# Patient Record
Sex: Female | Born: 1985 | Race: Black or African American | Hispanic: No | Marital: Married | Smoking: Never smoker
Health system: Southern US, Community
[De-identification: ages and names within clinical notes are randomized; demographics above are authoritative.]

## PROBLEM LIST (undated history)

## (undated) DIAGNOSIS — A6920 Lyme disease, unspecified: Secondary | ICD-10-CM

## (undated) DIAGNOSIS — G43909 Migraine, unspecified, not intractable, without status migrainosus: Secondary | ICD-10-CM

## (undated) DIAGNOSIS — I10 Essential (primary) hypertension: Secondary | ICD-10-CM

## (undated) DIAGNOSIS — F32A Depression, unspecified: Secondary | ICD-10-CM

## (undated) DIAGNOSIS — M3500 Sicca syndrome, unspecified: Secondary | ICD-10-CM

## (undated) DIAGNOSIS — B009 Herpesviral infection, unspecified: Secondary | ICD-10-CM

## (undated) DIAGNOSIS — D649 Anemia, unspecified: Secondary | ICD-10-CM

## (undated) DIAGNOSIS — F329 Major depressive disorder, single episode, unspecified: Secondary | ICD-10-CM

## (undated) DIAGNOSIS — K219 Gastro-esophageal reflux disease without esophagitis: Secondary | ICD-10-CM

## (undated) HISTORY — PX: WISDOM TOOTH EXTRACTION: SHX21

## (undated) HISTORY — PX: THERAPEUTIC ABORTION: SHX798

---

## 2011-09-06 ENCOUNTER — Other Ambulatory Visit (HOSPITAL_COMMUNITY)
Admission: RE | Admit: 2011-09-06 | Discharge: 2011-09-06 | Disposition: A | Discharge status: Home / Self Care | Payer: 59 | Source: Ambulatory Visit | Attending: Family Medicine | Admitting: Family Medicine

## 2011-09-06 DIAGNOSIS — Z124 Encounter for screening for malignant neoplasm of cervix: Secondary | ICD-10-CM | POA: Insufficient documentation

## 2012-01-19 ENCOUNTER — Emergency Department (HOSPITAL_BASED_OUTPATIENT_CLINIC_OR_DEPARTMENT_OTHER): Payer: 59

## 2012-01-19 ENCOUNTER — Emergency Department (HOSPITAL_BASED_OUTPATIENT_CLINIC_OR_DEPARTMENT_OTHER)
Admission: EM | Admit: 2012-01-19 | Discharge: 2012-01-19 | Disposition: A | Discharge status: Home / Self Care | Payer: 59 | Attending: Emergency Medicine | Admitting: Emergency Medicine

## 2012-01-19 ENCOUNTER — Encounter (HOSPITAL_BASED_OUTPATIENT_CLINIC_OR_DEPARTMENT_OTHER): Payer: Self-pay | Admitting: *Deleted

## 2012-01-19 DIAGNOSIS — Z8619 Personal history of other infectious and parasitic diseases: Secondary | ICD-10-CM | POA: Insufficient documentation

## 2012-01-19 DIAGNOSIS — Z8679 Personal history of other diseases of the circulatory system: Secondary | ICD-10-CM | POA: Insufficient documentation

## 2012-01-19 DIAGNOSIS — R0789 Other chest pain: Secondary | ICD-10-CM | POA: Insufficient documentation

## 2012-01-19 DIAGNOSIS — Z79899 Other long term (current) drug therapy: Secondary | ICD-10-CM | POA: Insufficient documentation

## 2012-01-19 DIAGNOSIS — R071 Chest pain on breathing: Secondary | ICD-10-CM | POA: Insufficient documentation

## 2012-01-19 HISTORY — DX: Lyme disease, unspecified: A69.20

## 2012-01-19 HISTORY — DX: Migraine, unspecified, not intractable, without status migrainosus: G43.909

## 2012-01-19 LAB — CBC WITH DIFFERENTIAL/PLATELET
Basophils Absolute: 0 10*3/uL (ref 0.0–0.1)
Basophils Relative: 0 % (ref 0–1)
Eosinophils Absolute: 0.1 10*3/uL (ref 0.0–0.7)
Eosinophils Relative: 1 % (ref 0–5)
HCT: 33.2 % — ABNORMAL LOW (ref 36.0–46.0)
Lymphocytes Relative: 49 % — ABNORMAL HIGH (ref 12–46)
MCH: 28.4 pg (ref 26.0–34.0)
MCHC: 33.7 g/dL (ref 30.0–36.0)
MCV: 84.3 fL (ref 78.0–100.0)
Monocytes Absolute: 0.5 10*3/uL (ref 0.1–1.0)
Platelets: 256 10*3/uL (ref 150–400)
RDW: 12.7 % (ref 11.5–15.5)
WBC: 6.4 10*3/uL (ref 4.0–10.5)

## 2012-01-19 LAB — D-DIMER, QUANTITATIVE: D-Dimer, Quant: 0.59 ug/mL-FEU — ABNORMAL HIGH (ref 0.00–0.48)

## 2012-01-19 LAB — BASIC METABOLIC PANEL
BUN: 7 mg/dL (ref 6–23)
CO2: 26 mEq/L (ref 19–32)
Calcium: 9.8 mg/dL (ref 8.4–10.5)
Chloride: 105 mEq/L (ref 96–112)
Creatinine, Ser: 0.9 mg/dL (ref 0.50–1.10)
Glucose, Bld: 91 mg/dL (ref 70–99)

## 2012-01-19 MED ORDER — HYDROCODONE-ACETAMINOPHEN 5-325 MG PO TABS: 2.0000 | ORAL_TABLET | ORAL | Status: AC | PRN

## 2012-01-19 MED ORDER — IOHEXOL 350 MG/ML SOLN
100.0000 mL | Freq: Once | INTRAVENOUS | Status: AC | PRN
  Administered 2012-01-19: 100 mL via INTRAVENOUS

## 2012-01-19 MED ORDER — FAMOTIDINE 20 MG PO TABS: 20.0000 mg | ORAL_TABLET | Freq: Two times a day (BID) | ORAL | Status: DC

## 2012-01-19 MED ORDER — GI COCKTAIL ~~LOC~~
30.0000 mL | Freq: Once | ORAL | Status: AC
  Administered 2012-01-19: 30 mL via ORAL
  Filled 2012-01-19: qty 30

## 2012-01-19 NOTE — ED Provider Notes (Signed)
History     CSN: [id number]  Arrival date & time 01/19/12  1944   First MD Initiated Contact with Patient 01/19/12 2059      Chief Complaint  Patient presents with  . Shortness of Breath    (Consider location/radiation/quality/duration/timing/severity/associated sxs/prior treatment) Patient is a 26 y.o. female presenting with shortness of breath. The history is provided by the patient. No language interpreter was used.  Shortness of Breath  The current episode started 5 to 7 days ago. The problem occurs continuously. The problem has been unchanged. The problem is moderate. Nothing relieves the symptoms. Nothing aggravates the symptoms. Associated symptoms include shortness of breath. There was no intake of a foreign body. She was not exposed to toxic fumes. She has not inhaled smoke recently. Her past medical history does not include asthma.  Pt complains of left sided chest pain,  Tightness in throat.   Pt reports her shoes have been fitting tighter than usual.   Pt reports she was on a trip to [place].  Pt flew home on 12/10.  Pt reports she has tried prilosec without relief   Past Medical History  Diagnosis Date  . Lyme disease   . Migraines     History reviewed. No pertinent past surgical history.  History reviewed. No pertinent family history.  History  Substance Use Topics  . Smoking status: Never Smoker   . Smokeless tobacco: Not on file  . Alcohol Use: No    OB History    Grav Para Term Preterm Abortions TAB SAB Ect Mult Living                  Review of Systems  Respiratory: Positive for chest tightness and shortness of breath.   All other systems reviewed and are negative.    Allergies  Review of patient's allergies indicates no known allergies.  Home Medications   Current Outpatient Rx  Name  Route  Sig  Dispense  Refill  . TOPIRAMATE 100 MG PO TABS   Oral   Take 100 mg by mouth 2 (two) times daily.           BP 148/85  Pulse 64  Temp 98.9  F (37.2 C) (Oral)  Resp 18  Ht 5\' 6"  (1.676 m)  Wt 200 lb (90.719 kg)  BMI 32.28 kg/m2  SpO2 100%  LMP 12/22/2011  Physical Exam  Nursing note and vitals reviewed. Constitutional: She is oriented to person, place, and time. She appears well-developed and well-nourished.  HENT:  Head: Normocephalic and atraumatic.  Eyes: Conjunctivae normal and EOM are normal. Pupils are equal, round, and reactive to light.  Neck: Normal range of motion. Neck supple.  Cardiovascular: Normal rate and normal heart sounds.   Pulmonary/Chest: Effort normal.  Abdominal: Soft.  Musculoskeletal: Normal range of motion.  Neurological: She is alert and oriented to person, place, and time.  Skin: Skin is warm.  Psychiatric: She has a normal mood and affect.    ED Course  Procedures (including critical care time)  Labs Reviewed - No data to display No results found.   No diagnosis found.    MDM   Results for orders placed during the hospital encounter of 01/19/12  D-DIMER, QUANTITATIVE      Component Value Range   D-Dimer, Quant 0.59 (*) 0.00 - 0.48 ug/mL-FEU  BASIC METABOLIC PANEL      Component Value Range   Sodium 140  135 - 145 mEq/L   Potassium 3.9  3.5 -  5.1 mEq/L   Chloride 105  96 - 112 mEq/L   CO2 26  19 - 32 mEq/L   Glucose, Bld 91  70 - 99 mg/dL   BUN 7  6 - 23 mg/dL   Creatinine, Ser 1.61  0.50 - 1.10 mg/dL   Calcium 9.8  8.4 - 09.6 mg/dL   GFR calc non Af Amer 86 (*) >90 mL/min   GFR calc Af Amer >90  >90 mL/min  CBC WITH DIFFERENTIAL      Component Value Range   WBC 6.4  4.0 - 10.5 K/uL   RBC 3.94  3.87 - 5.11 MIL/uL   Hemoglobin 11.2 (*) 12.0 - 15.0 g/dL   HCT 04.5 (*) 40.9 - 81.1 %   MCV 84.3  78.0 - 100.0 fL   MCH 28.4  26.0 - 34.0 pg   MCHC 33.7  30.0 - 36.0 g/dL   RDW 91.4  78.2 - 95.6 %   Platelets 256  150 - 400 K/uL   Neutrophils Relative 43  43 - 77 %   Neutro Abs 2.8  1.7 - 7.7 K/uL   Lymphocytes Relative 49 (*) 12 - 46 %   Lymphs Abs 3.1  0.7 - 4.0  K/uL   Monocytes Relative 7  3 - 12 %   Monocytes Absolute 0.5  0.1 - 1.0 K/uL   Eosinophils Relative 1  0 - 5 %   Eosinophils Absolute 0.1  0.0 - 0.7 K/uL   Basophils Relative 0  0 - 1 %   Basophils Absolute 0.0  0.0 - 0.1 K/uL   No results found.  Date: 01/19/2012  Rate: *68  Rhythm: normal sinus rhythm  QRS Axis: normal  Intervals: normal  ST/T Wave abnormalities: normal  Conduction Disutrbances:none  Narrative Interpretation:   Old EKG Reviewed: none available  Pt's ddimer is elevated,  Due to pain and recent travel ct scan is obtained.   No relief with Gi cocktail.   Pt given rx for hydrocodone.  Pt advised to continue pepcid and see her MD for recheck       Elson Areas, [place] 01/19/12 2224

## 2012-01-19 NOTE — ED Notes (Signed)
Pt c/o chest congestion and heaviness. Denies cough or fever. Denies CP at this time

## 2012-01-19 NOTE — ED Notes (Signed)
Pt was seen by PMD for same and started on prilosec without relief. Pt denies any burning feeling at this time. Pt also states that she has had swelling in her ankles.

## 2012-01-20 NOTE — ED Provider Notes (Signed)
History/physical exam/procedure(s) were performed by non-physician practitioner and as supervising physician I was immediately available for consultation/collaboration. I have reviewed all notes and am in agreement with care and plan.   Beryle Zeitz S Tinna Kolker, MD 01/20/12 1509 

## 2012-02-18 ENCOUNTER — Other Ambulatory Visit: Payer: Self-pay | Admitting: Family Medicine

## 2012-02-18 DIAGNOSIS — R1310 Dysphagia, unspecified: Secondary | ICD-10-CM

## 2012-02-23 ENCOUNTER — Other Ambulatory Visit: Payer: 59

## 2012-02-25 ENCOUNTER — Ambulatory Visit
Admission: RE | Admit: 2012-02-25 | Discharge: 2012-02-25 | Disposition: A | Discharge status: Home / Self Care | Payer: 59 | Source: Ambulatory Visit | Attending: Family Medicine | Admitting: Family Medicine

## 2012-02-25 DIAGNOSIS — R1310 Dysphagia, unspecified: Secondary | ICD-10-CM

## 2012-05-30 ENCOUNTER — Ambulatory Visit: Payer: Self-pay | Admitting: Nurse Practitioner

## 2012-10-30 ENCOUNTER — Other Ambulatory Visit (HOSPITAL_COMMUNITY)
Admission: RE | Admit: 2012-10-30 | Discharge: 2012-10-30 | Disposition: A | Discharge status: Home / Self Care | Payer: 59 | Source: Ambulatory Visit | Attending: Family Medicine | Admitting: Family Medicine

## 2012-10-30 ENCOUNTER — Other Ambulatory Visit: Payer: Self-pay | Admitting: Physician Assistant

## 2012-10-30 DIAGNOSIS — Z124 Encounter for screening for malignant neoplasm of cervix: Secondary | ICD-10-CM | POA: Insufficient documentation

## 2013-11-13 ENCOUNTER — Other Ambulatory Visit (HOSPITAL_COMMUNITY)
Admission: RE | Admit: 2013-11-13 | Discharge: 2013-11-13 | Disposition: A | Discharge status: Home / Self Care | Payer: 59 | Source: Ambulatory Visit | Attending: Family Medicine | Admitting: Family Medicine

## 2013-11-13 ENCOUNTER — Other Ambulatory Visit: Payer: Self-pay | Admitting: Physician Assistant

## 2013-11-13 DIAGNOSIS — Z124 Encounter for screening for malignant neoplasm of cervix: Secondary | ICD-10-CM | POA: Diagnosis present

## 2013-11-15 LAB — CYTOLOGY - PAP

## 2014-05-08 ENCOUNTER — Other Ambulatory Visit (HOSPITAL_COMMUNITY)
Admission: RE | Admit: 2014-05-08 | Discharge: 2014-05-08 | Disposition: A | Discharge status: Home / Self Care | Payer: Managed Care, Other (non HMO) | Source: Ambulatory Visit | Attending: Obstetrics & Gynecology | Admitting: Obstetrics & Gynecology

## 2014-05-08 ENCOUNTER — Other Ambulatory Visit: Payer: Self-pay | Admitting: Obstetrics & Gynecology

## 2014-05-08 DIAGNOSIS — Z1151 Encounter for screening for human papillomavirus (HPV): Secondary | ICD-10-CM | POA: Insufficient documentation

## 2014-05-08 DIAGNOSIS — Z01419 Encounter for gynecological examination (general) (routine) without abnormal findings: Secondary | ICD-10-CM | POA: Diagnosis not present

## 2014-05-13 LAB — CYTOLOGY - PAP

## 2014-09-27 IMAGING — CR DG CHEST 2V
2 series · 2 of 2 positions shown · non-contrast
Comparison: None.

CLINICAL DATA: Shortness of breath, chest congestion

CHEST - 2 VIEW

[w chest pa]
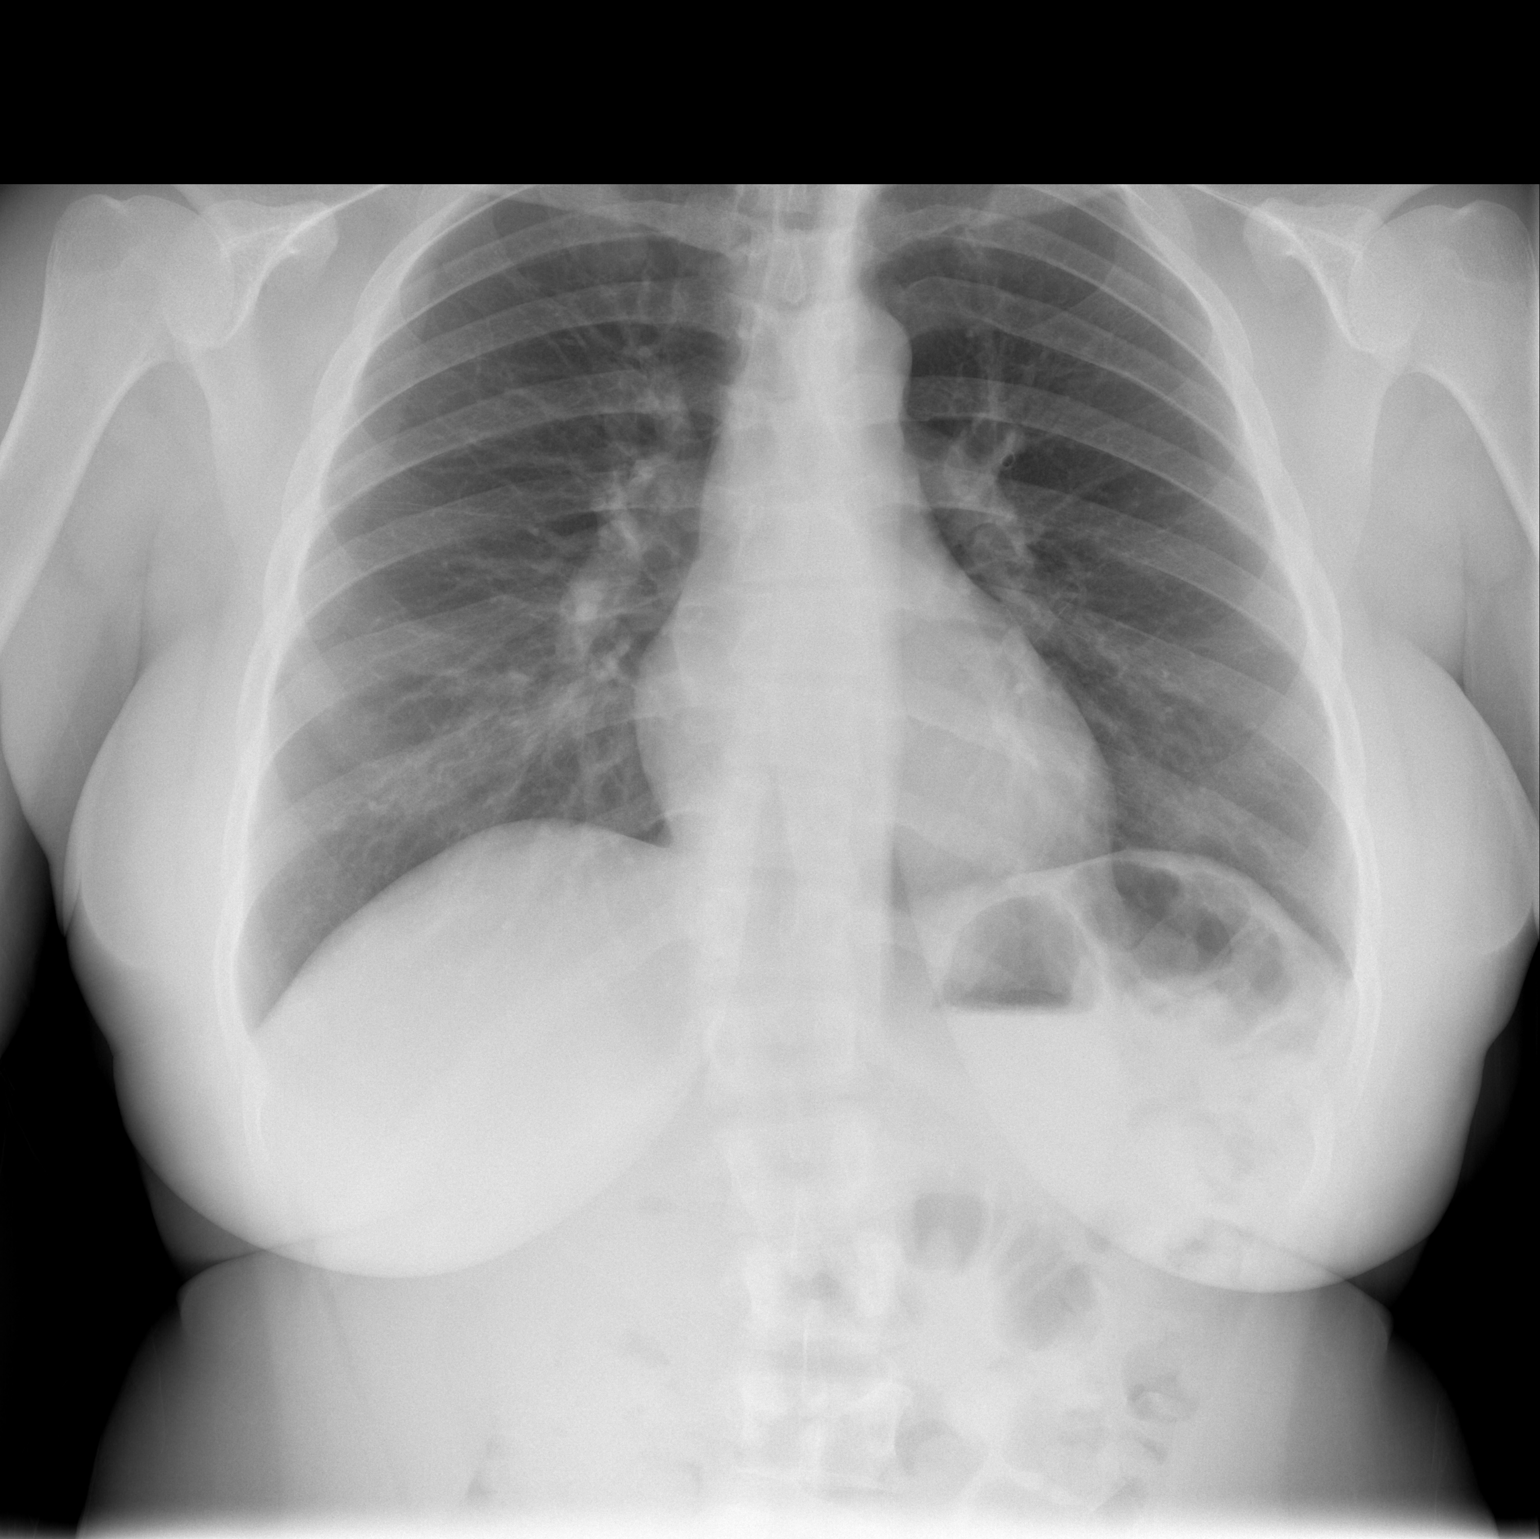

[w chest lat]
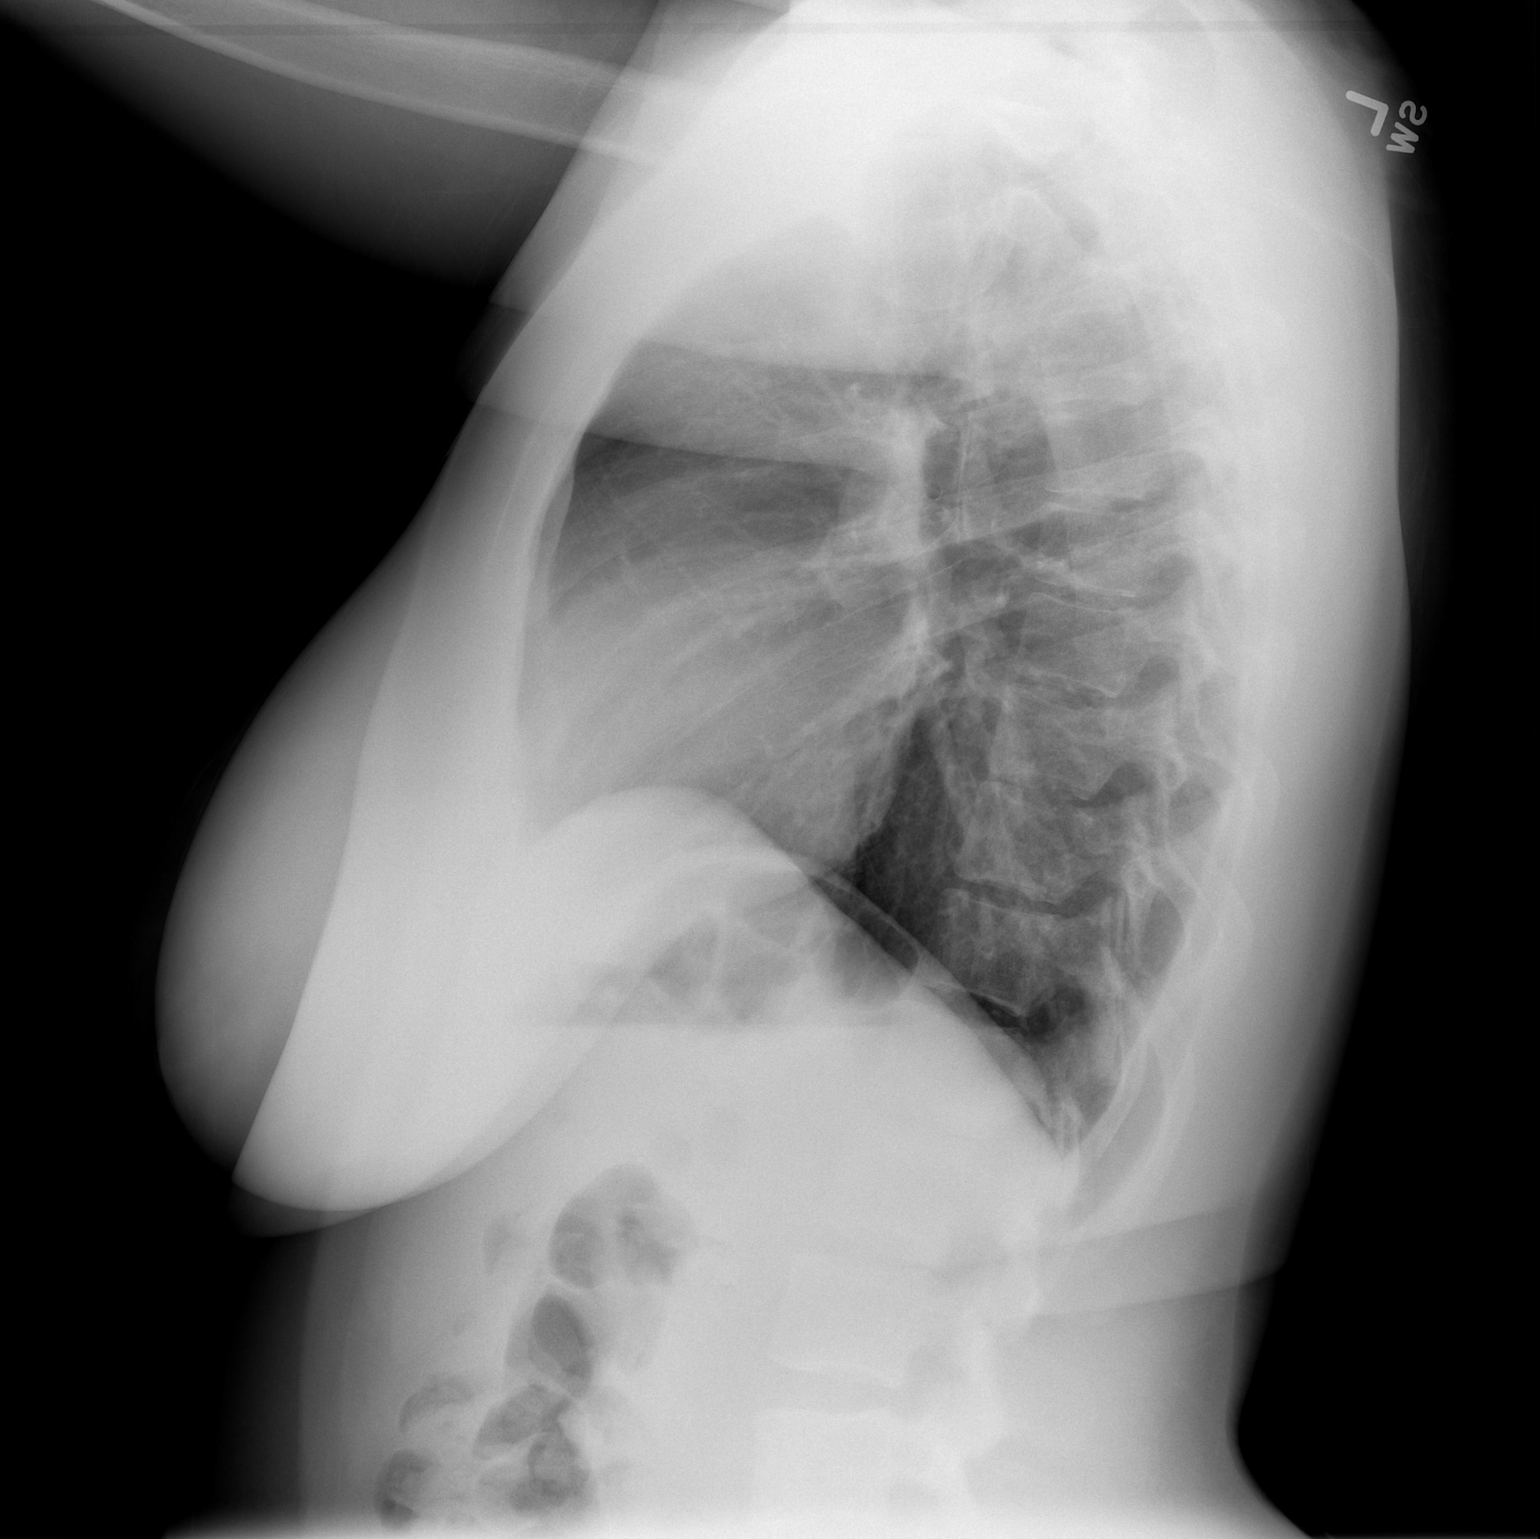

[2 of 2 positions shown; findings below may reference images not displayed]

FINDINGS: Lungs are clear. No pleural effusion or pneumothorax.

Cardiomediastinal silhouette is within normal limits.

Visualized osseous structures are within normal limits.
IMPRESSION: No evidence of acute cardiopulmonary disease.

## 2014-09-27 IMAGING — CT CT ANGIO CHEST
2 of 6 series · 19 of 36 positions shown · IV contrast (APPLIED)
Comparison: 01/19/2012 radiograph

CLINICAL DATA: Left-sided chest pain, elevated D-dimer.

CT ANGIOGRAPHY CHEST
TECHNIQUE: Multidetector CT imaging of the chest using the
standard protocol during bolus administration of intravenous
contrast. Multiplanar reconstructed images including MIPs were
obtained and reviewed to evaluate the vascular anatomy.
Contrast: 100mL OMNIPAQUE IOHEXOL 350 MG/ML SOLN

[Series 5: pe 1.0 b26f · axial · 0.68mm/px · z∈[+1034,+1256]mm · 18 of 248 slices shown]
[im 13/248  lung]
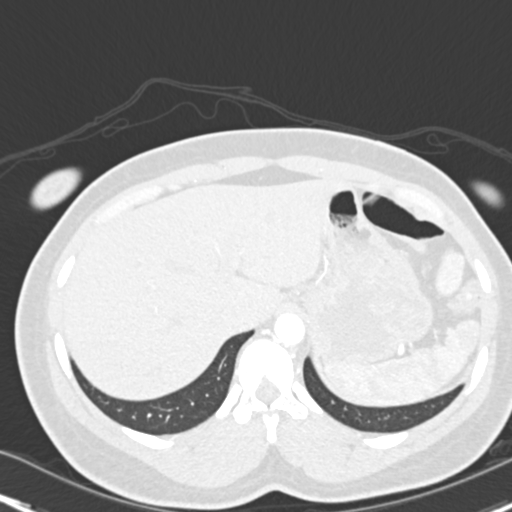
[im 25/248  mediastinal]
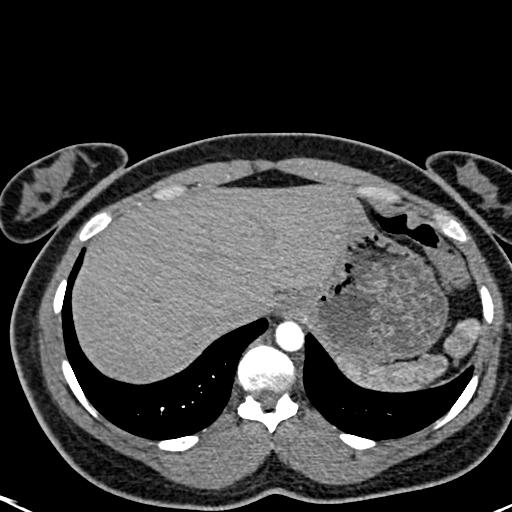
[im 38/248  lung]
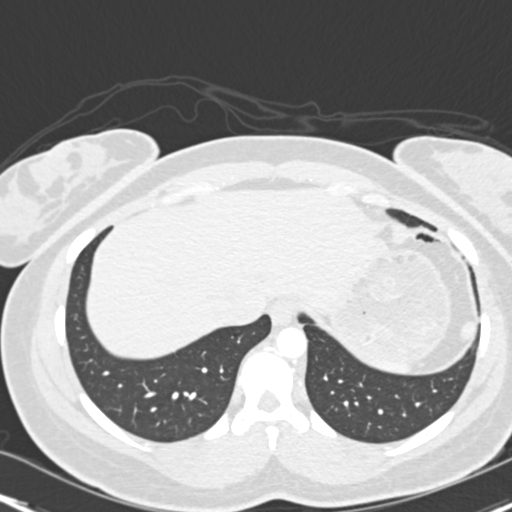
[im 50/248  mediastinal]
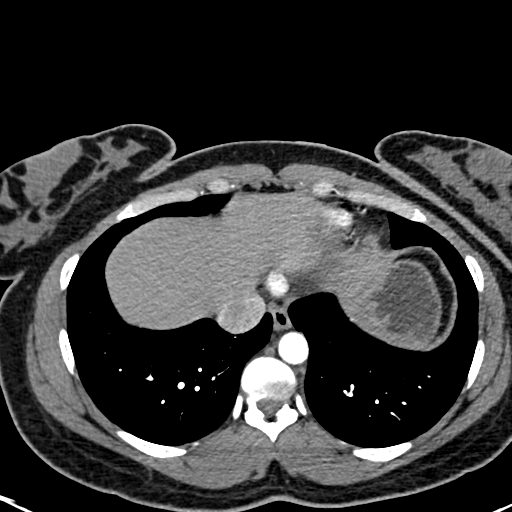
[im 62/248  lung]
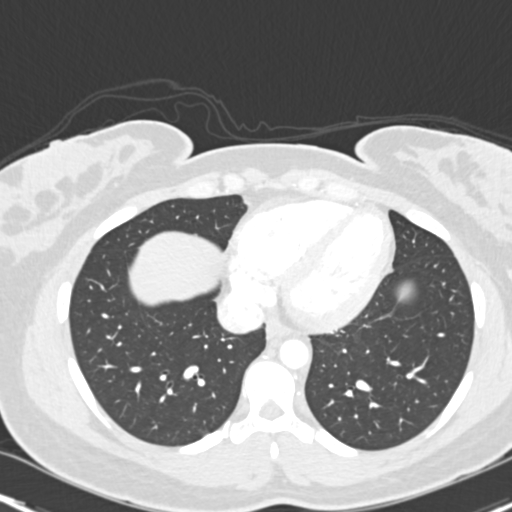
[im 75/248  mediastinal]
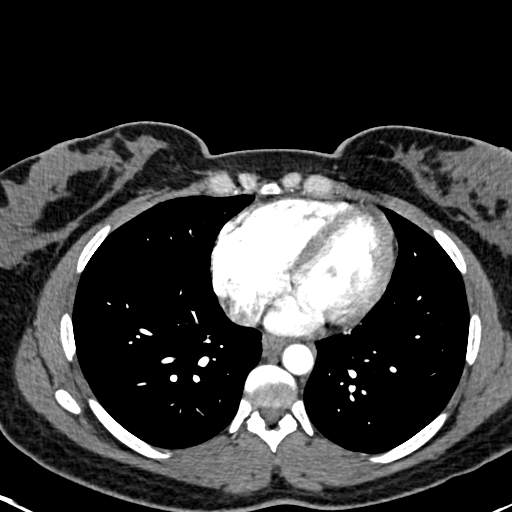
[im 87/248  lung]
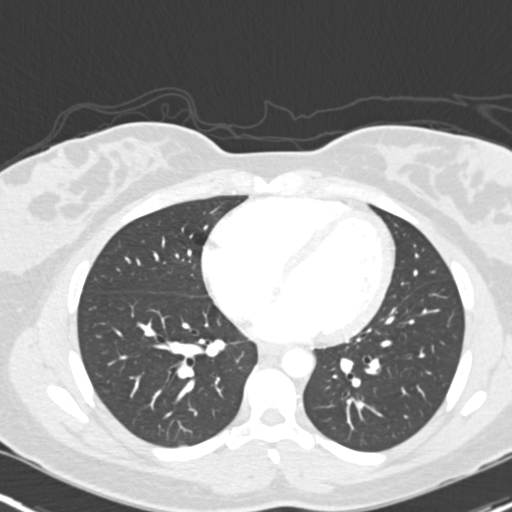
[im 99/248  mediastinal]
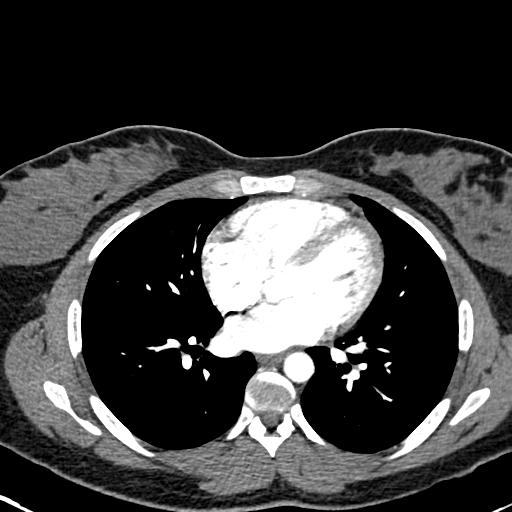
[im 112/248  lung]
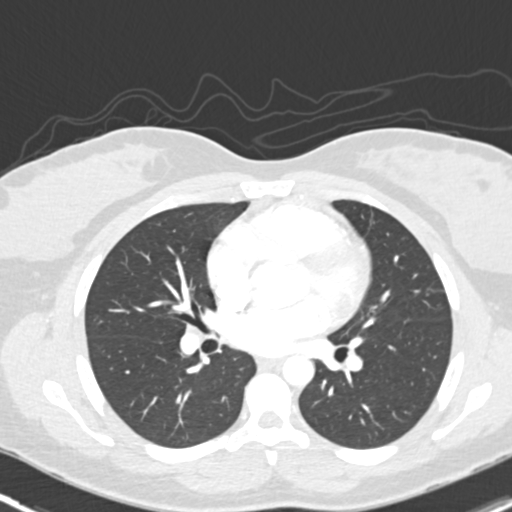
[im 136/248  mediastinal]
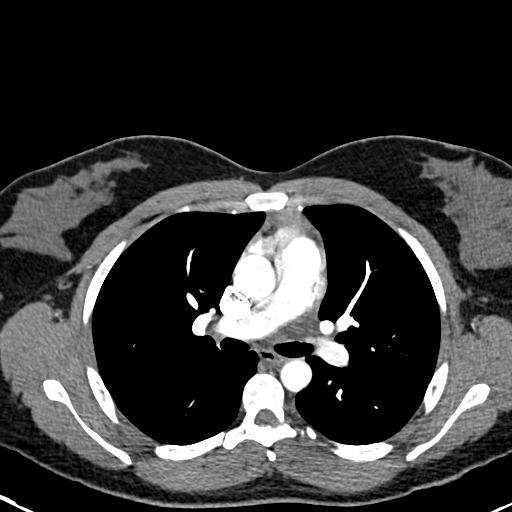
[im 149/248  lung]
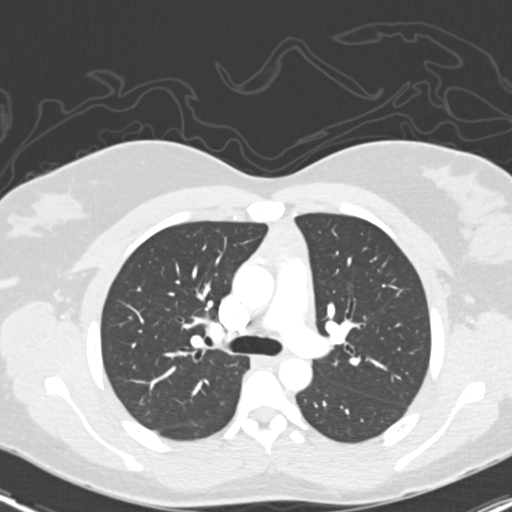
[im 161/248  mediastinal]
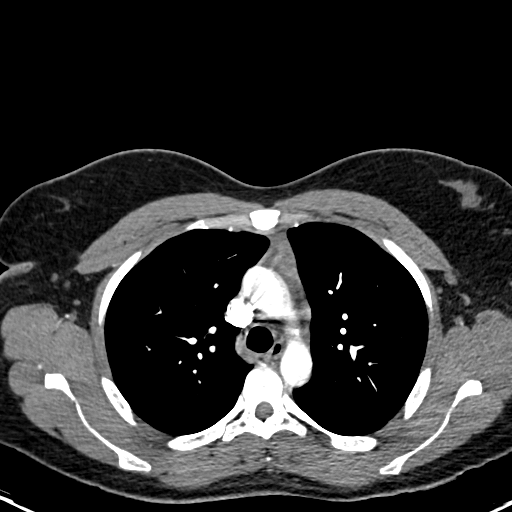
[im 173/248  lung]
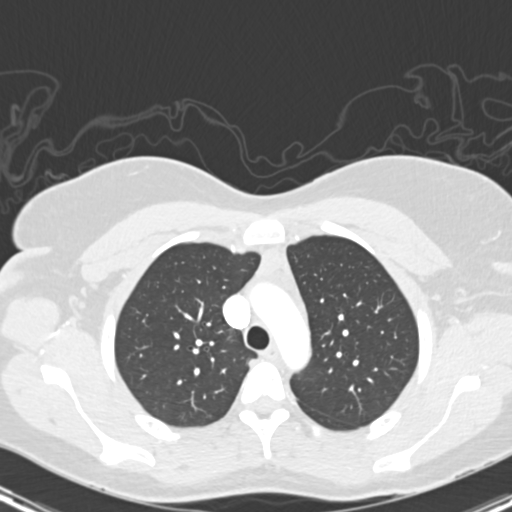
[im 186/248  mediastinal]
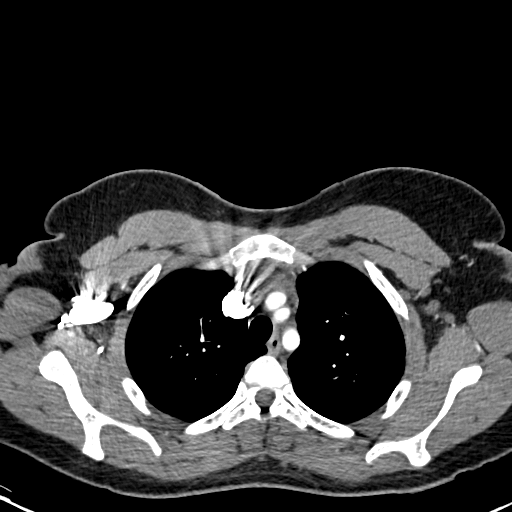
[im 198/248  lung]
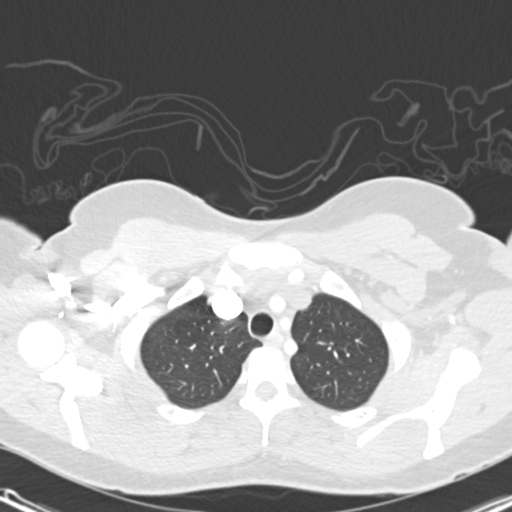
[im 210/248  mediastinal]
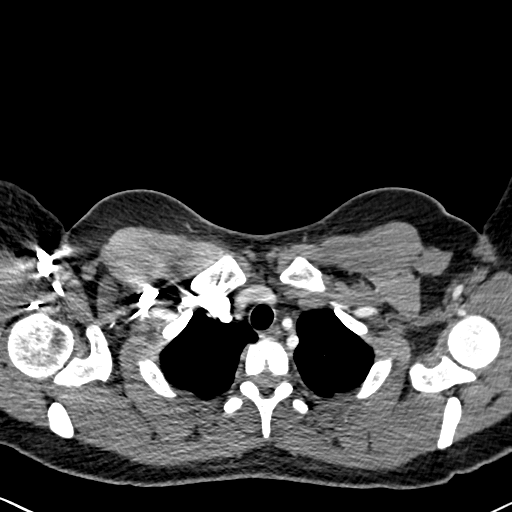
[im 223/248  lung]
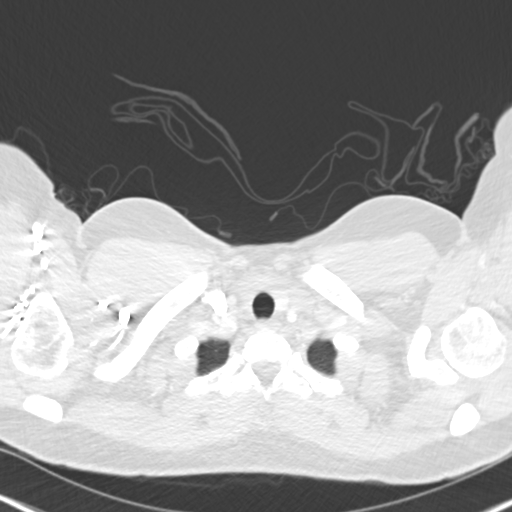
[im 235/248  mediastinal]
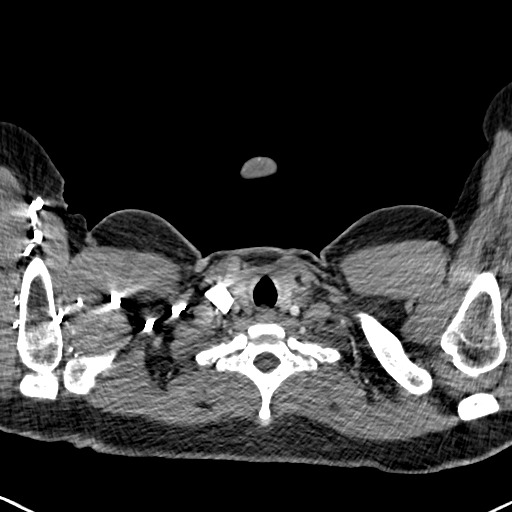

[Series 8: pe 2.0 coronal · coronal · 0.51mm/px · 1 of 119 slices shown]
[im 60/119  mediastinal]
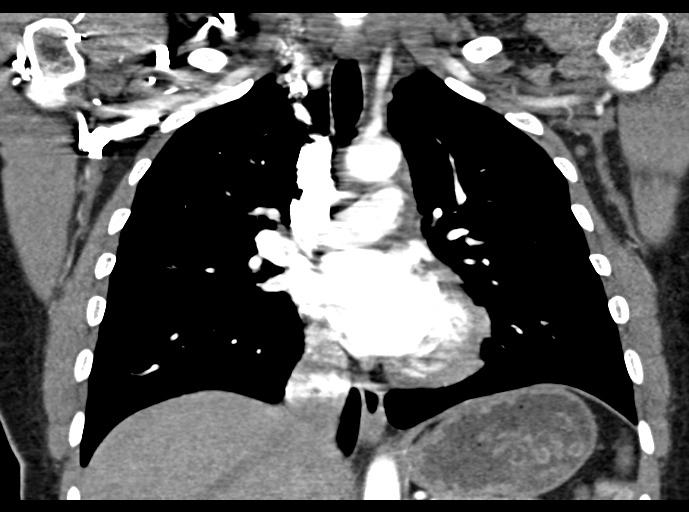

[19 of 36 positions shown; findings below may reference images not displayed]

FINDINGS: Pulmonary arterial branches are patent.  Normal caliber
aorta.  Heart size within normal limits.  No pleural or pericardial
effusion.  Anterior mediastinal soft tissue is presumably residual
thymus.  No intrathoracic lymphadenopathy.

Limited images through the upper abdomen show no acute finding.

Central airways are patent.  No pneumothorax.  3 mm nodule right
lower lobe on image 49 series 6 and left lower lobe on image 51.
No confluent airspace opacity.

No acute osseous finding.
IMPRESSION: No pulmonary embolism.

3 mm nodule within each lower lobe is favored to be post
infectious/inflammatory. If the patient is at high risk for
bronchogenic carcinoma, follow-up chest CT at 1 year is
recommended.  If the patient is at low risk, no follow-up is
needed.  This recommendation follows the consensus statement:
Guidelines for Management of Small Pulmonary Nodules Detected on CT
Scans:  A Statement from the [HOSPITAL] as published in

## 2015-02-12 ENCOUNTER — Other Ambulatory Visit: Payer: Self-pay | Admitting: Specialist

## 2015-03-18 ENCOUNTER — Ambulatory Visit: Admission: RE | Admit: 2015-03-18 | Payer: Managed Care, Other (non HMO) | Source: Ambulatory Visit

## 2015-03-18 ENCOUNTER — Ambulatory Visit
Admission: RE | Admit: 2015-03-18 | Discharge: 2015-03-18 | Disposition: A | Discharge status: Home / Self Care | Payer: Managed Care, Other (non HMO) | Source: Ambulatory Visit | Attending: Specialist | Admitting: Specialist

## 2015-03-18 DIAGNOSIS — Z01818 Encounter for other preprocedural examination: Secondary | ICD-10-CM | POA: Insufficient documentation

## 2015-06-16 NOTE — Patient Instructions (Addendum)
Your procedure is scheduled on:  Wednesday, Jun 25, 2015  Enter through the [employer] [facility] at:  [place] up the phone at the desk and dial [phone].  Call this number if you have problems the morning of surgery: [phone].  Remember:  Do NOT eat food: After Midnight Tuesday, Jun 24, 2015  Do NOT drink clear liquids after:  8:00 AM day of surgery  Take these medicines the morning of surgery with a SIP OF WATER: Lisinopril, Wellbutrin,  Do NOT wear jewelry (body piercing), metal hair clips/bobby pins, make-up, or nail polish. Do NOT wear lotions, powders, or perfumes.  You may wear deodorant. Do NOT shave for 48 hours prior to surgery. Do NOT bring valuables to the hospital. Contacts, dentures, or bridgework may not be worn into surgery.  Leave suitcase in car.  After surgery it may be brought to your room.  For patients admitted to the hospital, checkout time is 11:00 AM the day of discharge.

## 2015-06-17 ENCOUNTER — Encounter (HOSPITAL_COMMUNITY): Payer: Self-pay

## 2015-06-17 ENCOUNTER — Encounter (HOSPITAL_COMMUNITY)
Admission: RE | Admit: 2015-06-17 | Discharge: 2015-06-17 | Disposition: A | Discharge status: Home / Self Care | Payer: Managed Care, Other (non HMO) | Source: Ambulatory Visit | Attending: Obstetrics & Gynecology | Admitting: Obstetrics & Gynecology

## 2015-06-17 DIAGNOSIS — I10 Essential (primary) hypertension: Secondary | ICD-10-CM | POA: Diagnosis not present

## 2015-06-17 DIAGNOSIS — N946 Dysmenorrhea, unspecified: Secondary | ICD-10-CM | POA: Diagnosis not present

## 2015-06-17 DIAGNOSIS — Z01812 Encounter for preprocedural laboratory examination: Secondary | ICD-10-CM | POA: Diagnosis present

## 2015-06-17 DIAGNOSIS — F329 Major depressive disorder, single episode, unspecified: Secondary | ICD-10-CM | POA: Insufficient documentation

## 2015-06-17 DIAGNOSIS — M3500 Sicca syndrome, unspecified: Secondary | ICD-10-CM | POA: Insufficient documentation

## 2015-06-17 DIAGNOSIS — D649 Anemia, unspecified: Secondary | ICD-10-CM | POA: Insufficient documentation

## 2015-06-17 DIAGNOSIS — D259 Leiomyoma of uterus, unspecified: Secondary | ICD-10-CM | POA: Diagnosis not present

## 2015-06-17 DIAGNOSIS — K219 Gastro-esophageal reflux disease without esophagitis: Secondary | ICD-10-CM | POA: Insufficient documentation

## 2015-06-17 HISTORY — DX: Sjogren syndrome, unspecified: M35.00

## 2015-06-17 HISTORY — DX: Anemia, unspecified: D64.9

## 2015-06-17 HISTORY — DX: Major depressive disorder, single episode, unspecified: F32.9

## 2015-06-17 HISTORY — DX: Herpesviral infection, unspecified: B00.9

## 2015-06-17 HISTORY — DX: Gastro-esophageal reflux disease without esophagitis: K21.9

## 2015-06-17 HISTORY — DX: Depression, unspecified: F32.A

## 2015-06-17 HISTORY — DX: Essential (primary) hypertension: I10

## 2015-06-17 LAB — COMPREHENSIVE METABOLIC PANEL
ALBUMIN: 4.4 g/dL (ref 3.5–5.0)
ALK PHOS: 73 U/L (ref 38–126)
ALT: 15 U/L (ref 14–54)
ANION GAP: 6 (ref 5–15)
AST: 19 U/L (ref 15–41)
BILIRUBIN TOTAL: 0.4 mg/dL (ref 0.3–1.2)
BUN: 11 mg/dL (ref 6–20)
CALCIUM: 9.9 mg/dL (ref 8.9–10.3)
CO2: 26 mmol/L (ref 22–32)
Chloride: 107 mmol/L (ref 101–111)
Creatinine, Ser: 0.84 mg/dL (ref 0.44–1.00)
GFR calc non Af Amer: >60 mL/min (ref >60)
GLUCOSE: 88 mg/dL (ref 65–99)
POTASSIUM: 4.3 mmol/L (ref 3.5–5.1)
SODIUM: 139 mmol/L (ref 135–145)
TOTAL PROTEIN: 8.8 g/dL — AB (ref 6.5–8.1)

## 2015-06-17 LAB — CBC
HEMATOCRIT: 35 % — AB (ref 36.0–46.0)
Hemoglobin: 11.7 g/dL — ABNORMAL LOW (ref 12.0–15.0)
MCH: 28.7 pg (ref 26.0–34.0)
MCHC: 33.4 g/dL (ref 30.0–36.0)
MCV: 86 fL (ref 78.0–100.0)
Platelets: 314 10*3/uL (ref 150–400)
RBC: 4.07 MIL/uL (ref 3.87–5.11)
RDW: 13.6 % (ref 11.5–15.5)
WBC: 7.5 10*3/uL (ref 4.0–10.5)

## 2015-06-17 LAB — TYPE AND SCREEN
ABO/RH(D): O POS
ANTIBODY SCREEN: NEGATIVE

## 2015-06-17 LAB — ABO/RH: ABO/RH(D): O POS

## 2015-06-19 NOTE — H&P (Signed)
30yo G1P0010 who presents for myomectomy due to uterine fibroids, heavy menstrual bleeding and anemia. In review, she reports that over the past several months she has noted a change in her periods- mostly heavier periods. Previously with the IUD they were longer about 6 days, but the bleeding was light- now she is having a moderate menses using 3-4 tampons per day. She also notes increased dysmenorrhea and feels increased pelvic pressure. Additionally, during IC she feels like there is something there.  At this time, the patient wishes to proceed with surgical management of her uterine fibroids as she has failed medical treatment.    Current Medications  Taking   Skyla IUD   Lisinopril 20 MG Tablet 1 tablet Orally Once a day   Wellbutrin XL(BuPROPion HCl ER) 150 MG Tablet Extended Release 24 Hour 1 tablet in the morning Orally Once a day for depression   Lotrisone(Clotrimazole-Betamethasone) 0000000 % Cream 1 application to affected area Externally twice a day as needed   VESIcare(Solifenacin Succinate) 10 MG Tablet 1 tablet Orally Once a day, Notes: as needed   Nexium(Esomeprazole Magnesium) 40 MG Capsule Delayed Release as directed Orally Once a day, Notes: as needed   Valtrex(ValACYclovir HCl) 500 MG Tablet 1 tablet Orally every 12 hrs   Diazepam 5 MG Tablet 1 tablet as needed Orally daily for anxiety flare, Notes: as needed   Sumatriptan Succinate 100 Milligram Tablet TAKE 1 TABLET BY MOUTH AT ONSET OF HEADACHE. MAY REPEAT IN 2 HOURS IF NEEDED. MAX OF 2 TABLETS IN 24 HOURS.   Butalbital-APAP-Caffeine 50-325-40 MG Capsule 1 capsule as needed for tension headache Orally every 4 hrs as needed for headache   Medication List reviewed and reconciled with the patient    Past Medical History  Migraine HAs.   HTN.   Depression.   Lyme dz 2013.     +ANA, SSA+ Trudie Reed).   GERD (EGD 2014).   Genital Warts.   Sjogrens dx in 2013.    Surgical History  None    Family History  Father:  alive, diagnosed with HTN  Mother: alive, CVA at age 12, diagnosed with HTN, CVA  Paternal [place] Mother: diagnosed with CAD  Maternal Grand Father: alive, Parkinson's   Maternal Grand Mother: diagnosed with CVA  no autoimmune disease, denies any GYN family cancer hx.   Social History  General:  Tobacco use  cigarettes: Never smoked Tobacco history last updated 06/17/2015 no EXPOSURE TO PASSIVE SMOKE.  no Alcohol, 1-2 per week.  Caffeine: 1 serving daily, soda, tea.  no DIET.  Exercise: yes, walking about twice a week.  Marital Status: married.  Children: none.  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  from [place].   Gyn History  Sexual activity currently sexually active.  Periods : every month.  LMP 06/17/2015.  Birth control Sklya IUD- placed March 2015.  Last pap smear date 05/08/2014 Negative.  Denies H/O Abnormal pap smear.    OB History  Pregnancy # 1 abortion.    Allergies  N.K.D.A.   Hospitalization/Major Diagnostic Procedure  None    Review of Systems  CONSTITUTIONAL:  no Appetite changes. no Chills. no Fatigue. no Fever.  CARDIOLOGY:  no Chest pain.  UROLOGY:  no Dysuria. no Urinary frequency. no Urinary incontinence. no Urinary urgency.  RESPIRATORY:  no Shortness of breath. no Cough.  GASTROENTEROLOGY:  no Abdominal pain. no Change in bowel habits. no Change in bowel movements.  FEMALE REPRODUCTIVE:  no Abnormal vaginal  discharge. no Breast lumps or discharge. no Breast pain. no Hot flashes. no Vaginal irritation. no Vaginal itching.  NEUROLOGY:  no Dizziness. no Headache.  PSYCHOLOGY:  no Anxiety. no Depression.  SKIN:  no Rash. no Hives.  HEMATOLOGY/LYMPH:  yes Anemia. Using Blood Thinners no.     O: Examination performed in office on 5/16 Vital Signs  Wt 235, Wt change -3 lb, Ht 64.25, BMI 40.02, Pulse sitting 76, BP sitting 145/100, Repeat BP 150/94 Patient has not taken BP medication  today. She will take her BP medication before she goes to her pre[facility]op appt at [facility].   Examination  General Examination: GENERAL APPEARANCE well developed, well nourished .  SKIN: warm and dry, no rashes .  NECK: supple, normal appearance.  LUNGS: clear to auscultation bilaterally, no wheezes, rhonchi, rales .  HEART: no murmurs, regular rate and rhythm .  ABDOMEN: obese, no masses palpated, soft and not tender, no rebound, no guarding .  MUSCULOSKELETAL no calf tenderness bilaterally .  EXTREMITIES: no edema present .     Labs/Imaging:  Last [place] on 4/19: 8cm uterus with multiple fibroids: right fundal pedunculated: 7.6cm, anterior left: 4.5cm, LUS 2.1cm, posterior 1.5cm, midbody submucosal fundal: 1.7cm, within cavity hypoechoic mass 1.7cm- suggestive of uterine fibroid. Right ovary with 2 cyst- 1.7cm and 3cm complex cyst- possible hemorrhagic cyst.  Prior Hgb 10.9, last CBC on 5/11: Hgb: 11.8, plt: 266  A/P: 30yo G1P0010 who presents for myomectomy- hysteroscopic resection and open procedure. -NPO -LR @ 125cc/hr -Ancef 2g IV -SCDs to OR -Risk/benefit and indications reviewed with patient- she understands that there is potential for removal of the IUD during resection of the intracavity leiomyoma.  Also reviewed risk of bleeding, infection and injury to surrounding organs. Questions and concerns were addressed and pt wishes to proceed.  [name], DO [phone] (pager) [phone] (office)

## 2015-06-25 ENCOUNTER — Inpatient Hospital Stay (HOSPITAL_COMMUNITY)
Admission: RE | Admit: 2015-06-25 | Discharge: 2015-06-27 | DRG: 742 | Disposition: A | Discharge status: Home / Self Care | Payer: Managed Care, Other (non HMO) | Source: Ambulatory Visit | Attending: Obstetrics & Gynecology | Admitting: Obstetrics & Gynecology

## 2015-06-25 ENCOUNTER — Encounter (HOSPITAL_COMMUNITY): Payer: Self-pay | Admitting: Anesthesiology

## 2015-06-25 ENCOUNTER — Inpatient Hospital Stay (HOSPITAL_COMMUNITY): Payer: Managed Care, Other (non HMO) | Admitting: Certified Registered Nurse Anesthetist

## 2015-06-25 ENCOUNTER — Encounter (HOSPITAL_COMMUNITY)
Admission: RE | Disposition: A | Discharge status: Home / Self Care | Payer: Self-pay | Source: Ambulatory Visit | Attending: Obstetrics & Gynecology

## 2015-06-25 DIAGNOSIS — N920 Excessive and frequent menstruation with regular cycle: Secondary | ICD-10-CM | POA: Diagnosis present

## 2015-06-25 DIAGNOSIS — A630 Anogenital (venereal) warts: Secondary | ICD-10-CM | POA: Diagnosis present

## 2015-06-25 DIAGNOSIS — D649 Anemia, unspecified: Secondary | ICD-10-CM | POA: Diagnosis present

## 2015-06-25 DIAGNOSIS — N939 Abnormal uterine and vaginal bleeding, unspecified: Secondary | ICD-10-CM | POA: Diagnosis present

## 2015-06-25 DIAGNOSIS — K219 Gastro-esophageal reflux disease without esophagitis: Secondary | ICD-10-CM | POA: Diagnosis present

## 2015-06-25 DIAGNOSIS — F329 Major depressive disorder, single episode, unspecified: Secondary | ICD-10-CM | POA: Diagnosis present

## 2015-06-25 DIAGNOSIS — N946 Dysmenorrhea, unspecified: Secondary | ICD-10-CM | POA: Diagnosis present

## 2015-06-25 DIAGNOSIS — D252 Subserosal leiomyoma of uterus: Principal | ICD-10-CM | POA: Diagnosis present

## 2015-06-25 DIAGNOSIS — D259 Leiomyoma of uterus, unspecified: Secondary | ICD-10-CM | POA: Diagnosis present

## 2015-06-25 DIAGNOSIS — Z6841 Body Mass Index (BMI) 40.0 and over, adult: Secondary | ICD-10-CM

## 2015-06-25 DIAGNOSIS — I10 Essential (primary) hypertension: Secondary | ICD-10-CM | POA: Diagnosis present

## 2015-06-25 HISTORY — PX: DILATATION & CURETTAGE/HYSTEROSCOPY WITH MYOSURE: SHX6511

## 2015-06-25 HISTORY — PX: MYOMECTOMY: SHX85

## 2015-06-25 LAB — PREGNANCY, URINE: Preg Test, Ur: NEGATIVE

## 2015-06-25 SURGERY — MYOMECTOMY, ABDOMINAL APPROACH
Anesthesia: General | Site: Vagina

## 2015-06-25 MED ORDER — LACTATED RINGERS IV SOLN: INTRAVENOUS | Status: DC

## 2015-06-25 MED ORDER — LABETALOL HCL 5 MG/ML IV SOLN
INTRAVENOUS | Status: DC | PRN
  Administered 2015-06-25 (×4): 5 mg via INTRAVENOUS

## 2015-06-25 MED ORDER — DOCUSATE SODIUM 100 MG PO CAPS
100.0000 mg | ORAL_CAPSULE | Freq: Two times a day (BID) | ORAL | Status: DC
  Administered 2015-06-26 (×2): 100 mg via ORAL
  Filled 2015-06-25 (×2): qty 1

## 2015-06-25 MED ORDER — KETOROLAC TROMETHAMINE 30 MG/ML IJ SOLN
INTRAMUSCULAR | Status: AC
  Filled 2015-06-25: qty 1

## 2015-06-25 MED ORDER — HYDROMORPHONE HCL 1 MG/ML IJ SOLN
INTRAMUSCULAR | Status: AC
  Filled 2015-06-25: qty 1

## 2015-06-25 MED ORDER — MIDAZOLAM HCL 2 MG/2ML IJ SOLN
INTRAMUSCULAR | Status: DC | PRN
  Administered 2015-06-25: 2 mg via INTRAVENOUS

## 2015-06-25 MED ORDER — ONDANSETRON HCL 4 MG PO TABS: 4.0000 mg | ORAL_TABLET | Freq: Four times a day (QID) | ORAL | Status: DC | PRN

## 2015-06-25 MED ORDER — DEXAMETHASONE SODIUM PHOSPHATE 4 MG/ML IJ SOLN
INTRAMUSCULAR | Status: AC
  Filled 2015-06-25: qty 1

## 2015-06-25 MED ORDER — KETOROLAC TROMETHAMINE 30 MG/ML IJ SOLN
30.0000 mg | Freq: Four times a day (QID) | INTRAMUSCULAR | Status: DC
  Administered 2015-06-25 – 2015-06-27 (×6): 30 mg via INTRAVENOUS
  Filled 2015-06-25 (×5): qty 1

## 2015-06-25 MED ORDER — MEPERIDINE HCL 25 MG/ML IJ SOLN: 6.2500 mg | INTRAMUSCULAR | Status: DC | PRN

## 2015-06-25 MED ORDER — LIDOCAINE HCL (CARDIAC) 20 MG/ML IV SOLN
INTRAVENOUS | Status: AC
  Filled 2015-06-25: qty 5

## 2015-06-25 MED ORDER — MENTHOL 3 MG MT LOZG
1.0000 | LOZENGE | OROMUCOSAL | Status: DC | PRN
  Administered 2015-06-26: 3 mg via ORAL
  Filled 2015-06-25: qty 9

## 2015-06-25 MED ORDER — CEFAZOLIN SODIUM-DEXTROSE 2-4 GM/100ML-% IV SOLN
2.0000 g | INTRAVENOUS | Status: AC
  Administered 2015-06-25: 2 g via INTRAVENOUS

## 2015-06-25 MED ORDER — LACTATED RINGERS IV SOLN
INTRAVENOUS | Status: DC
  Administered 2015-06-25 (×3): via INTRAVENOUS

## 2015-06-25 MED ORDER — SCOPOLAMINE 1 MG/3DAYS TD PT72
1.0000 | MEDICATED_PATCH | Freq: Once | TRANSDERMAL | Status: DC
  Administered 2015-06-25: 1.5 mg via TRANSDERMAL

## 2015-06-25 MED ORDER — HYDROMORPHONE HCL 1 MG/ML IJ SOLN
INTRAMUSCULAR | Status: DC | PRN
  Administered 2015-06-25 (×2): 1 mg via INTRAVENOUS

## 2015-06-25 MED ORDER — FENTANYL CITRATE (PF) 100 MCG/2ML IJ SOLN
INTRAMUSCULAR | Status: DC | PRN
Start: 2015-06-25 — End: 2015-06-25
  Administered 2015-06-25 (×2): 100 ug via INTRAVENOUS
  Administered 2015-06-25: 50 ug via INTRAVENOUS

## 2015-06-25 MED ORDER — KETOROLAC TROMETHAMINE 30 MG/ML IJ SOLN: 30.0000 mg | Freq: Four times a day (QID) | INTRAMUSCULAR | Status: DC

## 2015-06-25 MED ORDER — SODIUM CHLORIDE 0.9 % IJ SOLN
INTRAMUSCULAR | Status: AC
Start: 2015-06-25 — End: 2015-06-25
  Filled 2015-06-25: qty 100

## 2015-06-25 MED ORDER — SODIUM CHLORIDE 0.9 % IR SOLN
Status: DC | PRN
  Administered 2015-06-25: 3000 mL

## 2015-06-25 MED ORDER — SCOPOLAMINE 1 MG/3DAYS TD PT72
MEDICATED_PATCH | TRANSDERMAL | Status: AC
  Administered 2015-06-25: 1.5 mg via TRANSDERMAL
  Filled 2015-06-25: qty 1

## 2015-06-25 MED ORDER — HYDROCODONE-ACETAMINOPHEN 5-325 MG PO TABS
1.0000 | ORAL_TABLET | ORAL | Status: DC | PRN
  Administered 2015-06-26 (×3): 1 via ORAL
  Filled 2015-06-25 (×3): qty 1

## 2015-06-25 MED ORDER — ONDANSETRON HCL 4 MG/2ML IJ SOLN
INTRAMUSCULAR | Status: AC
  Filled 2015-06-25: qty 2

## 2015-06-25 MED ORDER — LACTATED RINGERS IV SOLN
INTRAVENOUS | Status: DC
  Administered 2015-06-25 (×2): via INTRAVENOUS

## 2015-06-25 MED ORDER — ONDANSETRON HCL 4 MG/2ML IJ SOLN
4.0000 mg | Freq: Four times a day (QID) | INTRAMUSCULAR | Status: DC | PRN
  Administered 2015-06-25: 4 mg via INTRAVENOUS
  Filled 2015-06-25: qty 2

## 2015-06-25 MED ORDER — MIDAZOLAM HCL 2 MG/2ML IJ SOLN
INTRAMUSCULAR | Status: AC
  Filled 2015-06-25: qty 2

## 2015-06-25 MED ORDER — LABETALOL HCL 5 MG/ML IV SOLN
INTRAVENOUS | Status: AC
  Filled 2015-06-25: qty 4

## 2015-06-25 MED ORDER — HYDROMORPHONE HCL 1 MG/ML IJ SOLN
0.2500 mg | INTRAMUSCULAR | Status: DC | PRN
  Administered 2015-06-25: 0.5 mg via INTRAVENOUS

## 2015-06-25 MED ORDER — CEFAZOLIN SODIUM-DEXTROSE 2-4 GM/100ML-% IV SOLN
INTRAVENOUS | Status: AC
  Filled 2015-06-25: qty 100

## 2015-06-25 MED ORDER — DEXAMETHASONE SODIUM PHOSPHATE 10 MG/ML IJ SOLN
INTRAMUSCULAR | Status: DC | PRN
  Administered 2015-06-25: 4 mg via INTRAVENOUS

## 2015-06-25 MED ORDER — ROCURONIUM BROMIDE 100 MG/10ML IV SOLN
INTRAVENOUS | Status: AC
  Filled 2015-06-25: qty 1

## 2015-06-25 MED ORDER — PROPOFOL 10 MG/ML IV BOLUS
INTRAVENOUS | Status: DC | PRN
  Administered 2015-06-25: 200 mg via INTRAVENOUS

## 2015-06-25 MED ORDER — FENTANYL CITRATE (PF) 250 MCG/5ML IJ SOLN
INTRAMUSCULAR | Status: AC
  Filled 2015-06-25: qty 5

## 2015-06-25 MED ORDER — VASOPRESSIN 20 UNIT/ML IV SOLN
INTRAVENOUS | Status: AC
  Filled 2015-06-25: qty 1

## 2015-06-25 MED ORDER — ONDANSETRON HCL 4 MG/2ML IJ SOLN
INTRAMUSCULAR | Status: DC | PRN
  Administered 2015-06-25: 4 mg via INTRAVENOUS

## 2015-06-25 MED ORDER — ONDANSETRON HCL 4 MG/2ML IJ SOLN: 4.0000 mg | Freq: Once | INTRAMUSCULAR | Status: DC | PRN

## 2015-06-25 MED ORDER — GLYCOPYRROLATE 0.2 MG/ML IJ SOLN
INTRAMUSCULAR | Status: DC | PRN
  Administered 2015-06-25: 0.2 mg via INTRAVENOUS

## 2015-06-25 MED ORDER — KETOROLAC TROMETHAMINE 30 MG/ML IJ SOLN: 30.0000 mg | Freq: Once | INTRAMUSCULAR | Status: DC

## 2015-06-25 MED ORDER — ROCURONIUM BROMIDE 100 MG/10ML IV SOLN
INTRAVENOUS | Status: DC | PRN
  Administered 2015-06-25: 50 mg via INTRAVENOUS
  Administered 2015-06-25: 20 mg via INTRAVENOUS

## 2015-06-25 MED ORDER — NEOSTIGMINE METHYLSULFATE 10 MG/10ML IV SOLN
INTRAVENOUS | Status: DC | PRN
  Administered 2015-06-25: 2 mg via INTRAVENOUS

## 2015-06-25 MED ORDER — BUPROPION HCL ER (XL) 150 MG PO TB24
150.0000 mg | ORAL_TABLET | Freq: Every day | ORAL | Status: DC
  Administered 2015-06-26: 150 mg via ORAL
  Filled 2015-06-25 (×2): qty 1

## 2015-06-25 MED ORDER — PROPOFOL 10 MG/ML IV BOLUS
INTRAVENOUS | Status: AC
  Filled 2015-06-25: qty 20

## 2015-06-25 MED ORDER — ACETAMINOPHEN 10 MG/ML IV SOLN
1000.0000 mg | Freq: Once | INTRAVENOUS | Status: AC
  Administered 2015-06-25: 1000 mg via INTRAVENOUS
  Filled 2015-06-25: qty 100

## 2015-06-25 MED ORDER — VASOPRESSIN 20 UNIT/ML IV SOLN
INTRAVENOUS | Status: DC | PRN
  Administered 2015-06-25: 33 mL via INTRAMUSCULAR

## 2015-06-25 MED ORDER — LIDOCAINE HCL (CARDIAC) 20 MG/ML IV SOLN
INTRAVENOUS | Status: DC | PRN
  Administered 2015-06-25: 100 mg via INTRAVENOUS

## 2015-06-25 MED ORDER — KETOROLAC TROMETHAMINE 30 MG/ML IJ SOLN
INTRAMUSCULAR | Status: DC | PRN
  Administered 2015-06-25: 30 mg via INTRAVENOUS

## 2015-06-25 MED ORDER — ALUM & MAG HYDROXIDE-SIMETH 200-200-20 MG/5ML PO SUSP: 30.0000 mL | ORAL | Status: DC | PRN

## 2015-06-25 SURGICAL SUPPLY — 54 items
APPLICATOR COTTON TIP 6IN STRL (MISCELLANEOUS) ×4 IMPLANT
BARRIER ADHS 3X4 INTERCEED (GAUZE/BANDAGES/DRESSINGS) ×4 IMPLANT
BENZOIN TINCTURE PRP APPL 2/3 (GAUZE/BANDAGES/DRESSINGS) IMPLANT
CANISTER SUCT 3000ML (MISCELLANEOUS) ×4 IMPLANT
CANISTERS HI-FLOW 3000CC (CANNISTER) ×4 IMPLANT
CATH ROBINSON RED A/P 16FR (CATHETERS) ×4 IMPLANT
CLOSURE WOUND 1/2 X4 (GAUZE/BANDAGES/DRESSINGS)
CLOTH BEACON ORANGE TIMEOUT ST (SAFETY) ×4 IMPLANT
CONT PATH 16OZ SNAP LID 3702 (MISCELLANEOUS) ×4 IMPLANT
CONTAINER PREFILL 10% NBF 60ML (FORM) ×4 IMPLANT
DECANTER SPIKE VIAL GLASS SM (MISCELLANEOUS) ×4 IMPLANT
DEVICE MYOSURE LITE (MISCELLANEOUS) IMPLANT
DEVICE MYOSURE REACH (MISCELLANEOUS) ×4 IMPLANT
DILATOR CANAL MILEX (MISCELLANEOUS) IMPLANT
DRAPE CESAREAN BIRTH W POUCH (DRAPES) ×8 IMPLANT
DRSG OPSITE POSTOP 4X10 (GAUZE/BANDAGES/DRESSINGS) ×4 IMPLANT
GAUZE SPONGE 4X4 16PLY XRAY LF (GAUZE/BANDAGES/DRESSINGS) ×4 IMPLANT
GLOVE BIOGEL M 6.5 STRL (GLOVE) ×8 IMPLANT
GLOVE BIOGEL PI IND STRL 6.5 (GLOVE) ×4 IMPLANT
GLOVE BIOGEL PI IND STRL 7.0 (GLOVE) ×2 IMPLANT
GLOVE BIOGEL PI INDICATOR 6.5 (GLOVE) ×4
GLOVE BIOGEL PI INDICATOR 7.0 (GLOVE) ×2
GLOVE ECLIPSE 6.5 STRL STRAW (GLOVE) ×4 IMPLANT
GOWN STRL REUS W/TWL LRG LVL3 (GOWN DISPOSABLE) ×12 IMPLANT
HEMOSTAT SURGICEL 4X8 (HEMOSTASIS) IMPLANT
NEEDLE HYPO 22GX1.5 SAFETY (NEEDLE) ×4 IMPLANT
NS IRRIG 1000ML POUR BTL (IV SOLUTION) ×4 IMPLANT
PACK ABDOMINAL GYN (CUSTOM PROCEDURE TRAY) ×4 IMPLANT
PACK VAGINAL MINOR WOMEN LF (CUSTOM PROCEDURE TRAY) ×4 IMPLANT
PAD ABD 7.5X8 STRL (GAUZE/BANDAGES/DRESSINGS) IMPLANT
PAD OB MATERNITY 4.3X12.25 (PERSONAL CARE ITEMS) ×4 IMPLANT
PENCIL SMOKE EVAC W/HOLSTER (ELECTROSURGICAL) ×4 IMPLANT
SEAL ROD LENS SCOPE MYOSURE (ABLATOR) ×4 IMPLANT
SHEET LAVH (DRAPES) ×4 IMPLANT
STRIP CLOSURE SKIN 1/2X4 (GAUZE/BANDAGES/DRESSINGS) IMPLANT
SUT MNCRL AB 4-0 PS2 18 (SUTURE) ×12 IMPLANT
SUT PLAIN 2 0 (SUTURE) ×2
SUT PLAIN ABS 2-0 CT1 27XMFL (SUTURE) ×2 IMPLANT
SUT VIC AB 0 CT1 18XCR BRD8 (SUTURE) ×4 IMPLANT
SUT VIC AB 0 CT1 27 (SUTURE) ×8
SUT VIC AB 0 CT1 27XBRD ANBCTR (SUTURE) ×8 IMPLANT
SUT VIC AB 0 CT1 8-18 (SUTURE) ×4
SUT VIC AB 2-0 CT1 27 (SUTURE) ×2
SUT VIC AB 2-0 CT1 TAPERPNT 27 (SUTURE) ×2 IMPLANT
SUT VIC AB 3-0 SH 27 (SUTURE)
SUT VIC AB 3-0 SH 27X BRD (SUTURE) IMPLANT
SUT VIC AB 4-0 KS 27 (SUTURE) ×4 IMPLANT
SUT VIC AB 4-0 SH 27 (SUTURE)
SUT VIC AB 4-0 SH 27XANBCTRL (SUTURE) IMPLANT
SYR CONTROL 10ML LL (SYRINGE) ×4 IMPLANT
TOWEL OR 17X24 6PK STRL BLUE (TOWEL DISPOSABLE) ×8 IMPLANT
TRAY FOLEY CATH SILVER 14FR (SET/KITS/TRAYS/PACK) ×4 IMPLANT
WATER STERILE IRR 1000ML POUR (IV SOLUTION) IMPLANT
YANKAUER SUCT BULB TIP NO VENT (SUCTIONS) ×4 IMPLANT

## 2015-06-25 NOTE — Interval H&P Note (Signed)
History and Physical Interval Note:  06/25/2015 12:25 PM  Ruth Hill [name]  has presented today for surgery, with the diagnosis of D25.9 Fibroids N93.9 AUB D64.9 Anemia  The various methods of treatment have been discussed with the patient and family. After consideration of risks, benefits and other options for treatment, the patient has consented to  Procedure(s) with comments: MYOMECTOMY (N/A) - Myosure 1st Abdominal Myomectomy 2nd [place] (N/A) as a surgical intervention .  The patient's history has been reviewed, patient examined, no change in status, stable for surgery.  I have reviewed the patient's chart and labs.  Questions were answered to the patient's satisfaction.     [name], M

## 2015-06-25 NOTE — Transfer of Care (Signed)
Immediate Anesthesia Transfer of Care Note  Patient: Ruth Hill  Procedure(s) Performed: Procedure(s) with comments: MYOMECTOMY (N/A) - Myosure 1st Abdominal Myomectomy 2nd DILATATION & CURETTAGE/HYSTEROSCOPY WITH MYOSURE (N/A)  Patient Location: PACU  Anesthesia Type:General  Level of Consciousness: sedated  Airway & Oxygen Therapy: Patient Spontanous Breathing and Patient connected to nasal cannula oxygen  Post-op Assessment: Report given to RN and Post -op Vital signs reviewed and stable  Post vital signs: stable  Last Vitals:  Filed Vitals:   06/25/15 1224 06/25/15 1647  BP: 142/94   Pulse:  59  Temp:  36.7 C  Resp:  20    Last Pain: There were no vitals filed for this visit.       Complications: No apparent anesthesia complications

## 2015-06-25 NOTE — Anesthesia Procedure Notes (Signed)
Procedure Name: Intubation Date/Time: 06/25/2015 2:10 PM Performed by: Hewitt Blade Pre-anesthesia Checklist: Patient identified, Emergency Drugs available, Suction available and Patient being monitored Patient Re-evaluated:Patient Re-evaluated prior to inductionOxygen Delivery Method: Circle system utilized Preoxygenation: Pre-oxygenation with 100% oxygen Intubation Type: IV induction Ventilation: Mask ventilation without difficulty Laryngoscope Size: Mac and 3 Grade View: Grade I Tube type: Oral Tube size: 7.0 mm Number of attempts: 1 Airway Equipment and Method: Stylet Placement Confirmation: ETT inserted through vocal cords under direct vision,  positive ETCO2 and breath sounds checked- equal and bilateral Secured at: 22 cm Tube secured with: Tape Dental Injury: Teeth and Oropharynx as per pre-operative assessment

## 2015-06-25 NOTE — Anesthesia Preprocedure Evaluation (Signed)
Anesthesia Evaluation  Patient identified by MRN, date of birth, ID band Patient awake    Reviewed: Allergy & Precautions, H&P , NPO status , Patient's Chart, lab work & pertinent test results  Airway Mallampati: I  TM Distance: >3 FB Neck ROM: full    Dental no notable dental hx. (+) Teeth Intact   Pulmonary neg pulmonary ROS,    Pulmonary exam normal        Cardiovascular hypertension, Pt. on medications Normal cardiovascular exam     Neuro/Psych    GI/Hepatic Neg liver ROS, GERD  ,  Endo/Other  Morbid obesity  Renal/GU negative Renal ROS     Musculoskeletal   Abdominal (+) + obese,   Peds  Hematology negative hematology ROS (+)   Anesthesia Other Findings   Reproductive/Obstetrics negative OB ROS                             Anesthesia Physical Anesthesia Plan  ASA: III  Anesthesia Plan: General   Post-op Pain Management:    Induction: Intravenous  Airway Management Planned: Oral ETT  Additional Equipment:   Intra-op Plan:   Post-operative Plan: Extubation in OR  Informed Consent: I have reviewed the patients History and Physical, chart, labs and discussed the procedure including the risks, benefits and alternatives for the proposed anesthesia with the patient or authorized representative who has indicated his/her understanding and acceptance.   Dental Advisory Given  Plan Discussed with: CRNA and Surgeon  Anesthesia Plan Comments:         Anesthesia Quick Evaluation

## 2015-06-25 NOTE — Op Note (Signed)
Preoperative diagnosis: Uterine fibroids  Postoperative diagnosis: Same  Anesthesia: General  Procedure: Hysteroscopy, D&C, myosure myomectomy and  Abdominal myomectomy  Surgeon: Dr. Nelda Marseille  Asst.: Dr. Christophe Louis  Estimated blood loss: 200 cc IVF: 2200cc UOP: 400cc  Findings:  Hysteroscopic: 8cm anteverted uterus, both ostia visualized ~ 2cm uterine fibroid anterior left wall.   Abdominal findings: 7.5cm fundal fibroid, 4.5cm adjacent/lateral to fundal fibroid, 2cm LUS subserosal fibroid.  Normal tubes and ovaries bilaterally.  Procedure:  After being informed of the planned procedure with possible complications including but not limited to bleeding, infection, injury to other organs, informed consent was obtained. She is given general anesthesia with endotracheal intubation without complications.  The patient was placed in a low lithotomy position using Allen stirrups. She was then prepped and draped in the normal sterile fashion. The Foley catheter was inserted.  A sterile speculum was inserted into the vagina. A single tooth tenaculum was placed on the anterior lip of the cervix. The uterus was then sounded to 8cm. The IUD was removed.  The endocervical canal was then serially dilated using Hank dilators.  The diagnostic hysteroscope was then inserted without difficulty and noted to have the findings as listed above. Myosure was used and the fibroid was resected.  The hysteroscope was removed and sharp curettage was performed. The tissue was sent to pathology.  All instrument were then removed. Hemostasis was observed at the cervical site. Gown and gloves were changed and attention was turned to the abdomen. A Pfannenstiel incision which is brought down sharply to the fascia. The fascia is incised in a low transverse fashion. The peritoneum was identified and entered sharply.  The abdomen was examined with the findings as noted above.  peritoneum is entered bluntly.  The O'Sullivan-O'Connor  retractor was placed and the bowel was retracted. A dilute solution of vasopressin was injected into the uterus.  A diagonal vertical incision was made on the fundus of the uterus and the large fibroids were bluntly and sharply dissected.  Thel myometrial defects are closed with figure-of-eight stitches of 0 Vicryl. Attention was turned to the lower uterine segment and the area was injected with vasopressin.  A 2cm transverse incision was made and the remaining fibroid was removed.  In a similar fashion the defect was closed with figure-of-eight 0 Vicryl.  The serosa closed with 4-0 monocryl.  Attention was turned back to the fundal incision, hemostasis was achieved with addition figure of eight stitches.  The serosa was closed in a baseball fashion using 4-0 monocryl.  Adequate hemostasis was noted.  The pelvis was copiously irrigated and again examined for hemostasis.  Interceed was placed over the hysterotomy.  The peritoneum was closed in a running fashion using 2-0 vicryl.  The fascia is closed with 2 running sutures of 0 Vicryl meeting midline. The incision was irrigated with warm saline and hemostasis noted.  The subcutaneous layer was closed with plain gut. The skin was closed with 4-0 vicryl on a Keith needle.  Instruments and sponge count is complete 2. Pt tolerated procedure without any complication and was taken to recovery room in stable condition.  [name], DO [phone] (pager) [phone] (office)

## 2015-06-25 NOTE — Anesthesia Postprocedure Evaluation (Signed)
Anesthesia Post Note  Patient: Ruth Hill  Procedure(s) Performed: Procedure(s) (LRB): MYOMECTOMY (N/A) DILATATION & CURETTAGE/HYSTEROSCOPY WITH MYOSURE (N/A)  Patient location during evaluation: PACU Anesthesia Type: General Level of consciousness: sedated Pain management: pain level controlled Vital Signs Assessment: post-procedure vital signs reviewed and stable Respiratory status: spontaneous breathing Cardiovascular status: stable Postop Assessment: no signs of nausea or vomiting Anesthetic complications: no     Last Vitals:  Filed Vitals:   06/25/15 1730 06/25/15 1745  BP: 131/87   Pulse: 59 64  Temp:    Resp: 13 13    Last Pain:  Filed Vitals:   06/25/15 1745  PainSc: 5    Pain Goal: Patients Stated Pain Goal: 3 (06/25/15 1730)               Dalten Ambrosino JR,JOHN Mateo Flow

## 2015-06-26 ENCOUNTER — Encounter (HOSPITAL_COMMUNITY): Payer: Self-pay | Admitting: Obstetrics & Gynecology

## 2015-06-26 LAB — BASIC METABOLIC PANEL
Anion gap: 5 (ref 5–15)
BUN: 10 mg/dL (ref 6–20)
CALCIUM: 8.6 mg/dL — AB (ref 8.9–10.3)
CHLORIDE: 105 mmol/L (ref 101–111)
CO2: 25 mmol/L (ref 22–32)
CREATININE: 1.02 mg/dL — AB (ref 0.44–1.00)
Glucose, Bld: 119 mg/dL — ABNORMAL HIGH (ref 65–99)
Potassium: 3.7 mmol/L (ref 3.5–5.1)
SODIUM: 135 mmol/L (ref 135–145)

## 2015-06-26 LAB — CBC
HCT: 31 % — ABNORMAL LOW (ref 36.0–46.0)
Hemoglobin: 10.2 g/dL — ABNORMAL LOW (ref 12.0–15.0)
MCH: 27.7 pg (ref 26.0–34.0)
MCHC: 32.9 g/dL (ref 30.0–36.0)
MCV: 84.2 fL (ref 78.0–100.0)
PLATELETS: 262 10*3/uL (ref 150–400)
RBC: 3.68 MIL/uL — ABNORMAL LOW (ref 3.87–5.11)
RDW: 13.5 % (ref 11.5–15.5)
WBC: 15.3 10*3/uL — ABNORMAL HIGH (ref 4.0–10.5)

## 2015-06-26 NOTE — Addendum Note (Signed)
Addendum  created 06/26/15 0805 by Raenette Rover, CRNA   Modules edited: Clinical Notes   Clinical Notes:  File: [id number]

## 2015-06-26 NOTE — Anesthesia Postprocedure Evaluation (Signed)
Anesthesia Post Note  Patient: Ruth Hill  Procedure(s) Performed: Procedure(s) (LRB): MYOMECTOMY (N/A) DILATATION & CURETTAGE/HYSTEROSCOPY WITH MYOSURE (N/A)  Patient location during evaluation: [facility] Anesthesia Type: General Level of consciousness: awake, awake and alert, oriented and patient cooperative Pain management: pain level controlled Vital Signs Assessment: post-procedure vital signs reviewed and stable Respiratory status: spontaneous breathing, nonlabored ventilation and respiratory function stable Cardiovascular status: stable Postop Assessment: no headache, no backache, patient able to bend at knees and no signs of nausea or vomiting Anesthetic complications: no     Last Vitals:  Filed Vitals:   06/26/15 0108 06/26/15 0506  BP: 119/70 116/71  Pulse: 62 65  Temp: 37.1 C 37.2 C  Resp: 18 18    Last Pain:  Filed Vitals:   06/26/15 0514  PainSc: 2    Pain Goal: Patients Stated Pain Goal: 3 (06/26/15 0513)               [name] L

## 2015-06-26 NOTE — Progress Notes (Signed)
Postoperative Note Day # 1  S:  Patient resting comfortable in bed.  Pain controlled.  Tolerating light general diet. No flatus, no BM.  Minimal bleeding.  Ambulating without difficulty.  She denies n/v/f/c, SOB, or CP.  Overall doing well and reports no acute complaints.  O: Temp:  [97.6 F (36.4 C)-98.9 F (37.2 C)] 98.9 F (37.2 C) (05/25 0506) Pulse Rate:  [56-78] 65 (05/25 0506) Resp:  [12-20] 18 (05/25 0506) BP: (116-147)/(66-96) 116/71 mmHg (05/25 0506) SpO2:  [94 %-100 %] 99 % (05/25 0506) Weight:  [107.049 kg (236 lb)] 107.049 kg (236 lb) (05/24 1831)   Gen: A&Ox3, NAD CV: RRR, no MRG Resp: CTAB Abdomen: soft, NT, ND +BS Uterus: firm, non-tender, below umbilicus Incision: c/d/i, bandage on Ext: No edema, no calf tenderness bilaterally, SCDs in place  Labs:  Results for orders placed or performed during the hospital encounter of 06/25/15 (from the past 24 hour(s))  Pregnancy, urine     Status: None   Collection Time: 06/25/15 11:55 AM  Result Value Ref Range   Preg Test, Ur NEGATIVE NEGATIVE  CBC     Status: Abnormal   Collection Time: 06/26/15  5:29 AM  Result Value Ref Range   WBC 15.3 (H) 4.0 - 10.5 K/uL   RBC 3.68 (L) 3.87 - 5.11 MIL/uL   Hemoglobin 10.2 (L) 12.0 - 15.0 g/dL   HCT 31.0 (L) 36.0 - 46.0 %   MCV 84.2 78.0 - 100.0 fL   MCH 27.7 26.0 - 34.0 pg   MCHC 32.9 30.0 - 36.0 g/dL   RDW 13.5 11.5 - 15.5 %   Platelets 262 150 - 400 K/uL  Basic metabolic panel     Status: Abnormal   Collection Time: 06/26/15  5:29 AM  Result Value Ref Range   Sodium 135 135 - 145 mmol/L   Potassium 3.7 3.5 - 5.1 mmol/L   Chloride 105 101 - 111 mmol/L   CO2 25 22 - 32 mmol/L   Glucose, Bld 119 (H) 65 - 99 mg/dL   BUN 10 6 - 20 mg/dL   Creatinine, Ser 1.02 (H) 0.44 - 1.00 mg/dL   Calcium 8.6 (L) 8.9 - 10.3 mg/dL   GFR calc non Af Amer >60 >60 mL/min   GFR calc Af Amer >60 >60 mL/min   Anion gap 5 5 - 15   A/P: Pt is a 30 y.o. G1P0010 s/p myomectomy, POD#1  - Pain  well controlled, will transition to oral medication -GU: UOP is adequate, pt has already voided this am -GI: Tolerating general diet- advance as tolerarted -Activity: encouraged sitting up to chair and ambulation as tolerated -Prophylaxis: SCDs while in bed -Labs: slight increase in Cr, likely due to decreased UOP immediately postoperatively, will repeat in am tomorrow.   DISPO: Continue routine postoperative care.  [name], DO [phone] (pager) [phone] (office)

## 2015-06-27 LAB — BASIC METABOLIC PANEL
ANION GAP: 6 (ref 5–15)
BUN: 9 mg/dL (ref 6–20)
CO2: 25 mmol/L (ref 22–32)
Calcium: 8.6 mg/dL — ABNORMAL LOW (ref 8.9–10.3)
Chloride: 108 mmol/L (ref 101–111)
Creatinine, Ser: 0.81 mg/dL (ref 0.44–1.00)
GFR calc Af Amer: >60 mL/min (ref >60)
Glucose, Bld: 86 mg/dL (ref 65–99)
POTASSIUM: 3.7 mmol/L (ref 3.5–5.1)
SODIUM: 139 mmol/L (ref 135–145)

## 2015-06-27 MED ORDER — IBUPROFEN 600 MG PO TABS: 600.0000 mg | ORAL_TABLET | Freq: Four times a day (QID) | ORAL | Status: DC | PRN

## 2015-06-27 MED ORDER — DOCUSATE SODIUM 100 MG PO CAPS: 100.0000 mg | ORAL_CAPSULE | Freq: Two times a day (BID) | ORAL | Status: DC

## 2015-06-27 MED ORDER — HYDROCODONE-ACETAMINOPHEN 5-325 MG PO TABS: 1.0000 | ORAL_TABLET | Freq: Four times a day (QID) | ORAL | Status: DC | PRN

## 2015-06-27 NOTE — Progress Notes (Signed)
Pt verbalizes understanding of d/c instructions, medications, follow up appts, when to seek medical attention and belongings policy. IV was d/c by NT without complications. Pt was encouraged to check room thoroughly for belongings prior to d/c. Pt was given a copy of d/c instructions & prescriptions. No questions at this time. Pts SO is at the bedside and is driving her home. NT walked with pt to main entrance. Marry Guan

## 2015-06-27 NOTE — Discharge Instructions (Signed)
Myomectomy, Care After Refer to this sheet in the next few weeks. These instructions provide you with information on caring for yourself after your procedure. Your health care provider may also give you more specific instructions. Your treatment has been planned according to current medical practices, but problems sometimes occur. Call your health care provider if you have any problems or questions after your procedure. WHAT TO EXPECT AFTER THE PROCEDURE After your procedure, it is typical to have the following:  Pain in your abdomen, especially at any incision sites. You will be given pain medicine to control the pain.  Tiredness. This is a normal part of the recovery process. Your energy level will return to normal over the next several weeks.  Constipation.  Vaginal bleeding. This is normal and should stop after 1-2 weeks. HOME CARE INSTRUCTIONS   Only take over-the-counter or prescription medicines as directed by your health care provider. Avoid aspirin because it can cause bleeding.  Please alternate between vicodin and motrin as needed for pain.  Vicodin may cause constipation- so please be sure to take Colace twice daily as needed.  Do not douche, use tampons, or have sexual intercourse until given permission by your health care provider.  Remove or change any bandages (dressings) as directed by your health care provider.  Take showers instead of baths as directed by your health care provider.  You will probably be able to go back to your normal routine after a few days. Do not do anything that requires extra effort until your health care provider says it is okay. Do not lift anything heavier than 15 pounds (6.8 kg) until your health care provider approves.  Walk daily but take frequent rest breaks if you tire easily.  Continue to practice deep breathing and coughing. If it hurts to cough, try holding a pillow against your belly as you cough.  If you become constipated, you  may:  Use a mild laxative if your health care provider approves.  Add more fruit and bran to your diet.  Drink enough fluids to keep your urine clear or pale yellow.  Take your temperature twice a day and write it down.  Do not drink alcohol.  Do not drive until your health care provider approves.  Have someone help you at home for 1 week or until you can do your own household activities.  Follow up with your health care provider as directed. SEEK MEDICAL CARE IF:  You have a fever.  You have increasing abdominal pain that is not relieved with medicine.  You have nausea, vomiting, or diarrhea.  You have pain when you urinate, or you have blood in your urine.  You have a rash on your body.  You have pain or redness where your IV access tube was inserted.  You have redness, swelling, or any kind of drainage from an incision. SEEK IMMEDIATE MEDICAL CARE IF:   You have weakness or lightheadedness.  You have pain, swelling, or redness in your legs.  You have chest pain.  You faint.  You have shortness of breath.  You have heavy vaginal bleeding.  Your incision is opening up.   This information is not intended to replace advice given to you by your health care provider. Make sure you discuss any questions you have with your health care provider.   Document Released: 06/10/2010 Document Revised: 02/08/2014 Document Reviewed: 08/30/2012 Elsevier Interactive Patient Education [employer].

## 2015-06-27 NOTE — Progress Notes (Signed)
Postoperative Note Day # 2  S:  Patient resting comfortable in bed.  Pain controlled.  Tolerating general diet. No flatus- but reports lots of "grumbling" and feels like she needs to, no BM.  No vaginal bleeding.  Ambulating without difficulty.  She denies n/v/f/c, SOB, or CP.  Overall doing well and reports no acute complaints.  O: Temp:  [98.3 F (36.8 C)-99 F (37.2 C)] 98.4 F (36.9 C) (05/26 0552) Pulse Rate:  [65-82] 65 (05/26 0552) Resp:  [16-18] 18 (05/26 0552) BP: (116-130)/(67-84) 126/77 mmHg (05/26 0552) SpO2:  [98 %-100 %] 98 % (05/26 0552)   Gen: A&Ox3, NAD CV: RRR, no MRG Resp: CTAB Abdomen: soft, NT, ND +BS Uterus: firm, non-tender, below umbilicus Incision: c/d/i, bandage on Ext: No edema, no calf tenderness bilaterally  Labs:  Results for orders placed or performed during the hospital encounter of 06/25/15 (from the past 24 hour(s))  Basic metabolic panel     Status: Abnormal   Collection Time: 06/27/15  5:07 AM  Result Value Ref Range   Sodium 139 135 - 145 mmol/L   Potassium 3.7 3.5 - 5.1 mmol/L   Chloride 108 101 - 111 mmol/L   CO2 25 22 - 32 mmol/L   Glucose, Bld 86 65 - 99 mg/dL   BUN 9 6 - 20 mg/dL   Creatinine, Ser 0.81 0.44 - 1.00 mg/dL   Calcium 8.6 (L) 8.9 - 10.3 mg/dL   GFR calc non Af Amer >60 >60 mL/min   GFR calc Af Amer >60 >60 mL/min   Anion gap 6 5 - 15   A/P: Pt is a 30 y.o. G1P0010 s/p myomectomy, POD#2  - Pain well controlled, doing well with oral pain management -GU: voiding freely -GI: Tolerating general diet -Activity: encouraged sitting up to chair and ambulation as tolerated -Prophylaxis: SCDs while in bed -Labs: stable, Cr improved  DISPO: meeting postoperative milestones appropriately, plan for discharge home today  [name], DO [phone] (pager) [phone] (office)

## 2015-07-03 NOTE — Discharge Summary (Signed)
Physician Discharge Summary  Patient ID: Ruth Hill MRN: [id number] DOB/AGE: 1985/05/28 30 y.o.  Admit date: 06/25/2015 Discharge date: 06/27/2015  Admission Diagnoses: Uterine leiomyoma, Abnormal uterine bleeding  Discharge Diagnoses:  Active Problems:   Uterine leiomyoma   Discharged Condition: stable  Hospital Course: 30yo G1P0010 who presents for myomectomy due to uterine fibroids, heavy menstrual bleeding and anemia.  She underwent a hysteroscopic and abdominal myomectomy.  For information regarding the procedure, please see the operative report.  Her postoperative course was uncomplicated and she was discharged home in stable condition on POD#2  Consults: None  Significant Diagnostic Studies: labs:  CBC Latest Ref Rng 06/26/2015 06/17/2015 01/19/2012  WBC 4.0 - 10.5 K/uL 15.3(H) 7.5 6.4  Hemoglobin 12.0 - 15.0 g/dL 10.2(L) 11.7(L) 11.2(L)  Hematocrit 36.0 - 46.0 % 31.0(L) 35.0(L) 33.2(L)  Platelets 150 - 400 K/uL 262 314 256    Treatments: IV hydration, antibiotics: Ancef, analgesia: Toradol, Percocet and surgery: Hysteroscopic and abdominal myomectomy  Discharge Exam: Blood pressure 126/77, pulse 65, temperature 98.4 F (36.9 C), temperature source Oral, resp. rate 18, height 5\' 4"  (1.626 m), weight 107.049 kg (236 lb), SpO2 98 %.  Gen: A&Ox3, NAD CV: RRR, no MRG Resp: CTAB Abdomen: soft, NT, ND +BS Uterus: firm, non-tender, below umbilicus Incision: c/d/i, bandage on Ext: No edema, no calf tenderness bilaterally  Disposition: 01-Home or Self Care     Medication List    TAKE these medications        buPROPion 150 MG 24 hr tablet  Commonly known as:  WELLBUTRIN XL  Take 150 mg by mouth daily.     cyclobenzaprine 10 MG tablet  Commonly known as:  FLEXERIL  Take 10 mg by mouth 3 (three) times daily as needed for muscle spasms.     diclofenac 25 MG EC tablet  Commonly known as:  VOLTAREN  Take 25 mg by mouth 2 (two) times daily as needed for mild pain.      docusate sodium 100 MG capsule  Commonly known as:  COLACE  Take 1 capsule (100 mg total) by mouth 2 (two) times daily.     HYDROcodone-acetaminophen 5-325 MG tablet  Commonly known as:  NORCO/VICODIN  Take 1-2 tablets by mouth every 6 (six) hours as needed for moderate pain.     ibuprofen 600 MG tablet  Commonly known as:  ADVIL,MOTRIN  Take 1 tablet (600 mg total) by mouth every 6 (six) hours as needed.     lisinopril 20 MG tablet  Commonly known as:  PRINIVIL,ZESTRIL  Take 20 mg by mouth daily.     multivitamin with minerals Tabs tablet  Take 1 tablet by mouth daily.     ranitidine 150 MG tablet  Commonly known as:  ZANTAC  Take 150 mg by mouth daily as needed for heartburn.     solifenacin 10 MG tablet  Commonly known as:  VESICARE  Take 10 mg by mouth daily.     valACYclovir 500 MG tablet  Commonly known as:  VALTREX  Take 500 mg by mouth 2 (two) times daily as needed (For outbreak.).           Follow-up Information    Follow up with [name], M, DO In 2 weeks.   Specialty:  Obstetrics and Gynecology   Contact information:   [id number] [place] [address] [phone]       Signed: Annalee Genta 07/03/2015, 9:25 PM

## 2015-08-07 ENCOUNTER — Other Ambulatory Visit: Payer: Self-pay | Admitting: Physician Assistant

## 2015-08-07 DIAGNOSIS — Z8249 Family history of ischemic heart disease and other diseases of the circulatory system: Secondary | ICD-10-CM

## 2015-08-18 ENCOUNTER — Ambulatory Visit
Admission: RE | Admit: 2015-08-18 | Discharge: 2015-08-18 | Disposition: A | Discharge status: Home / Self Care | Payer: Managed Care, Other (non HMO) | Source: Ambulatory Visit | Attending: Physician Assistant | Admitting: Physician Assistant

## 2015-08-18 DIAGNOSIS — Z8249 Family history of ischemic heart disease and other diseases of the circulatory system: Secondary | ICD-10-CM

## 2015-10-03 ENCOUNTER — Encounter
Admission: RE | Admit: 2015-10-03 | Discharge: 2015-10-03 | Disposition: A | Discharge status: Home / Self Care | Payer: Managed Care, Other (non HMO) | Source: Ambulatory Visit | Attending: Specialist | Admitting: Specialist

## 2015-10-03 ENCOUNTER — Other Ambulatory Visit: Payer: Managed Care, Other (non HMO)

## 2015-10-03 DIAGNOSIS — Z01812 Encounter for preprocedural laboratory examination: Secondary | ICD-10-CM | POA: Insufficient documentation

## 2015-10-03 LAB — CBC
HEMATOCRIT: 36 % (ref 35.0–47.0)
HEMOGLOBIN: 11.9 g/dL — AB (ref 12.0–16.0)
MCH: 27.7 pg (ref 26.0–34.0)
MCHC: 33 g/dL (ref 32.0–36.0)
MCV: 84 fL (ref 80.0–100.0)
Platelets: 284 10*3/uL (ref 150–440)
RBC: 4.28 MIL/uL (ref 3.80–5.20)
RDW: 14.4 % (ref 11.5–14.5)
WBC: 6.1 10*3/uL (ref 3.6–11.0)

## 2015-10-03 LAB — BASIC METABOLIC PANEL
Anion gap: 6 (ref 5–15)
BUN: 11 mg/dL (ref 6–20)
CHLORIDE: 107 mmol/L (ref 101–111)
CO2: 26 mmol/L (ref 22–32)
CREATININE: 0.85 mg/dL (ref 0.44–1.00)
Calcium: 9.3 mg/dL (ref 8.9–10.3)
GFR calc Af Amer: >60 mL/min (ref >60)
GFR calc non Af Amer: >60 mL/min (ref >60)
GLUCOSE: 85 mg/dL (ref 65–99)
POTASSIUM: 3.7 mmol/L (ref 3.5–5.1)
SODIUM: 139 mmol/L (ref 135–145)

## 2015-10-03 LAB — TYPE AND SCREEN
ABO/RH(D): O POS
Antibody Screen: NEGATIVE

## 2015-10-03 NOTE — Patient Instructions (Signed)
Your procedure is scheduled on: Tuesday 10/07/15 Report to Day Surgery. 2ND FLOOR MEDICAL MALL ENTRANCE AT 11:30 To find out your arrival time please call [phone] between 1PM - 3PM on .  Remember: Instructions that are not followed completely may result in serious medical risk, up to and including death, or upon the discretion of your surgeon and anesthesiologist your surgery may need to be rescheduled.    __X__ 1. Do not eat food or drink liquids after midnight. No gum chewing or hard candies.     __X__ 2. No Alcohol for 24 hours before or after surgery.   ____ 3. Bring all medications with you on the day of surgery if instructed.    __X__ 4. Notify your doctor if there is any change in your medical condition     (cold, fever, infections).     Do not wear jewelry, make-up, hairpins, clips or nail polish.  Do not wear lotions, powders, or perfumes.   Do not shave 48 hours prior to surgery. Men may shave face and neck.  Do not bring valuables to the hospital.    [facility] is not responsible for any belongings or valuables.               Contacts, dentures or bridgework may not be worn into surgery.  Leave your suitcase in the car. After surgery it may be brought to your room.  For patients admitted to the hospital, discharge time is determined by your                treatment team.   Patients discharged the day of surgery will not be allowed to drive home.   Please read over the following fact sheets that you were given:   Surgical Site Infection Prevention   __X__ Take these medicines the morning of surgery with A SIP OF WATER:    1. LISINOPRIL  2. RANITIDINE  3.   4.  5.  6.  ____ Fleet Enema (as directed)   __X__ Use CHG Soap as directed  ____ Use inhalers on the day of surgery  ____ Stop metformin 2 days prior to surgery    ____ Take 1/2 of usual insulin dose the night before surgery and none on the morning of surgery.   ____ Stop Coumadin/Plavix/aspirin on    ____ Stop Anti-inflammatories on    ____ Stop supplements until after surgery.    ____ Bring C-Pap to the hospital.

## 2015-10-07 ENCOUNTER — Observation Stay
Admission: RE | Admit: 2015-10-07 | Discharge: 2015-10-08 | DRG: 621 | Disposition: A | Discharge status: Home / Self Care | Payer: Managed Care, Other (non HMO) | Source: Ambulatory Visit | Attending: Specialist | Admitting: Specialist

## 2015-10-07 ENCOUNTER — Encounter
Admission: RE | Disposition: A | Discharge status: Home / Self Care | Payer: Self-pay | Source: Ambulatory Visit | Attending: Specialist

## 2015-10-07 ENCOUNTER — Encounter: Payer: Self-pay | Admitting: *Deleted

## 2015-10-07 ENCOUNTER — Ambulatory Visit: Payer: Managed Care, Other (non HMO) | Admitting: Anesthesiology

## 2015-10-07 DIAGNOSIS — K219 Gastro-esophageal reflux disease without esophagitis: Secondary | ICD-10-CM | POA: Diagnosis present

## 2015-10-07 DIAGNOSIS — Z6841 Body Mass Index (BMI) 40.0 and over, adult: Secondary | ICD-10-CM | POA: Diagnosis not present

## 2015-10-07 DIAGNOSIS — Z79899 Other long term (current) drug therapy: Secondary | ICD-10-CM

## 2015-10-07 DIAGNOSIS — Z8249 Family history of ischemic heart disease and other diseases of the circulatory system: Secondary | ICD-10-CM

## 2015-10-07 DIAGNOSIS — I10 Essential (primary) hypertension: Secondary | ICD-10-CM | POA: Diagnosis present

## 2015-10-07 HISTORY — PX: LAPAROSCOPIC GASTRIC SLEEVE RESECTION: SHX5895

## 2015-10-07 LAB — CBC
HCT: 35.2 % (ref 35.0–47.0)
HEMOGLOBIN: 11.5 g/dL — AB (ref 12.0–16.0)
MCH: 27.5 pg (ref 26.0–34.0)
MCHC: 32.6 g/dL (ref 32.0–36.0)
MCV: 84.4 fL (ref 80.0–100.0)
Platelets: 266 10*3/uL (ref 150–440)
RBC: 4.17 MIL/uL (ref 3.80–5.20)
RDW: 14.6 % — ABNORMAL HIGH (ref 11.5–14.5)
WBC: 12.9 10*3/uL — ABNORMAL HIGH (ref 3.6–11.0)

## 2015-10-07 LAB — CREATININE, SERUM
CREATININE: 0.87 mg/dL (ref 0.44–1.00)
GFR calc Af Amer: >60 mL/min (ref >60)

## 2015-10-07 LAB — POCT PREGNANCY, URINE: PREG TEST UR: NEGATIVE

## 2015-10-07 SURGERY — GASTRECTOMY, SLEEVE, LAPAROSCOPIC
Anesthesia: General | Wound class: Clean Contaminated

## 2015-10-07 MED ORDER — NEOSTIGMINE METHYLSULFATE 10 MG/10ML IV SOLN
INTRAVENOUS | Status: DC | PRN
  Administered 2015-10-07: 3 mg via INTRAVENOUS

## 2015-10-07 MED ORDER — ACETAMINOPHEN 160 MG/5ML PO SOLN
650.0000 mg | ORAL | Status: DC | PRN
  Administered 2015-10-08: 650 mg via ORAL
  Filled 2015-10-07 (×3): qty 20.3

## 2015-10-07 MED ORDER — LABETALOL HCL 5 MG/ML IV SOLN
10.0000 mg | Freq: Once | INTRAVENOUS | Status: AC
  Administered 2015-10-07: 10 mg via INTRAVENOUS

## 2015-10-07 MED ORDER — MIDAZOLAM HCL 2 MG/2ML IJ SOLN
INTRAMUSCULAR | Status: DC | PRN
  Administered 2015-10-07: 2 mg via INTRAVENOUS

## 2015-10-07 MED ORDER — ACETAMINOPHEN 160 MG/5ML PO SOLN
325.0000 mg | ORAL | Status: DC | PRN
  Filled 2015-10-07: qty 20.3

## 2015-10-07 MED ORDER — ONDANSETRON HCL 4 MG/2ML IJ SOLN
4.0000 mg | INTRAMUSCULAR | Status: DC | PRN
  Administered 2015-10-07 – 2015-10-08 (×3): 4 mg via INTRAVENOUS
  Filled 2015-10-07 (×3): qty 2

## 2015-10-07 MED ORDER — CEFAZOLIN SODIUM-DEXTROSE 2-4 GM/100ML-% IV SOLN
INTRAVENOUS | Status: AC
  Filled 2015-10-07: qty 100

## 2015-10-07 MED ORDER — BUPIVACAINE-EPINEPHRINE (PF) 0.5% -1:200000 IJ SOLN
INTRAMUSCULAR | Status: AC
  Filled 2015-10-07: qty 30

## 2015-10-07 MED ORDER — FENTANYL CITRATE (PF) 100 MCG/2ML IJ SOLN
25.0000 ug | INTRAMUSCULAR | Status: DC | PRN
  Administered 2015-10-07 (×3): 25 ug via INTRAVENOUS

## 2015-10-07 MED ORDER — DEXTROSE 5 % IV SOLN
2.0000 g | Freq: Three times a day (TID) | INTRAVENOUS | Status: AC
  Administered 2015-10-07 – 2015-10-08 (×2): 2 g via INTRAVENOUS
  Filled 2015-10-07 (×2): qty 2

## 2015-10-07 MED ORDER — SCOPOLAMINE 1 MG/3DAYS TD PT72
1.0000 | MEDICATED_PATCH | TRANSDERMAL | Status: DC
  Administered 2015-10-07: 1 via TRANSDERMAL

## 2015-10-07 MED ORDER — LABETALOL HCL 5 MG/ML IV SOLN
5.0000 mg | Freq: Once | INTRAVENOUS | Status: AC
  Administered 2015-10-07: 5 mg via INTRAVENOUS

## 2015-10-07 MED ORDER — CEFOXITIN SODIUM-DEXTROSE 2-2.2 GM-% IV SOLR (PREMIX)
2.0000 g | Freq: Once | INTRAVENOUS | Status: AC
  Administered 2015-10-07: 2 g via INTRAVENOUS

## 2015-10-07 MED ORDER — LIDOCAINE-EPINEPHRINE (PF) 1 %-1:200000 IJ SOLN
INTRAMUSCULAR | Status: DC | PRN
  Administered 2015-10-07: 9 mL

## 2015-10-07 MED ORDER — SUCCINYLCHOLINE CHLORIDE 20 MG/ML IJ SOLN
INTRAMUSCULAR | Status: DC | PRN
  Administered 2015-10-07: 140 mg via INTRAVENOUS

## 2015-10-07 MED ORDER — SCOPOLAMINE 1 MG/3DAYS TD PT72
MEDICATED_PATCH | TRANSDERMAL | Status: AC
  Administered 2015-10-07: 1 via TRANSDERMAL
  Filled 2015-10-07: qty 1

## 2015-10-07 MED ORDER — HYDRALAZINE HCL 20 MG/ML IJ SOLN
INTRAMUSCULAR | Status: AC
  Administered 2015-10-07: 10 mg via INTRAVENOUS
  Filled 2015-10-07: qty 1

## 2015-10-07 MED ORDER — BUPIVACAINE-EPINEPHRINE (PF) 0.5% -1:200000 IJ SOLN
INTRAMUSCULAR | Status: DC | PRN
  Administered 2015-10-07: 20 mL

## 2015-10-07 MED ORDER — ENOXAPARIN SODIUM 40 MG/0.4ML ~~LOC~~ SOLN
40.0000 mg | Freq: Two times a day (BID) | SUBCUTANEOUS | Status: DC
  Administered 2015-10-08: 40 mg via SUBCUTANEOUS
  Filled 2015-10-07: qty 0.4

## 2015-10-07 MED ORDER — ONDANSETRON HCL 4 MG/2ML IJ SOLN: 4.0000 mg | Freq: Once | INTRAMUSCULAR | Status: DC | PRN

## 2015-10-07 MED ORDER — PROPOFOL 10 MG/ML IV BOLUS
INTRAVENOUS | Status: DC | PRN
  Administered 2015-10-07: 150 mg via INTRAVENOUS

## 2015-10-07 MED ORDER — HYDRALAZINE HCL 20 MG/ML IJ SOLN
10.0000 mg | Freq: Once | INTRAMUSCULAR | Status: AC
  Administered 2015-10-07: 10 mg via INTRAVENOUS

## 2015-10-07 MED ORDER — OXYCODONE HCL 5 MG/5ML PO SOLN: 5.0000 mg | ORAL | Status: DC | PRN

## 2015-10-07 MED ORDER — LABETALOL HCL 5 MG/ML IV SOLN
INTRAVENOUS | Status: AC
  Administered 2015-10-07: 10 mg via INTRAVENOUS
  Filled 2015-10-07: qty 4

## 2015-10-07 MED ORDER — ACETAMINOPHEN 10 MG/ML IV SOLN
INTRAVENOUS | Status: AC
  Filled 2015-10-07: qty 100

## 2015-10-07 MED ORDER — CEFOXITIN SODIUM-DEXTROSE 2-2.2 GM-% IV SOLR (PREMIX)
INTRAVENOUS | Status: AC
  Filled 2015-10-07: qty 50

## 2015-10-07 MED ORDER — LIDOCAINE-EPINEPHRINE (PF) 1 %-1:200000 IJ SOLN
INTRAMUSCULAR | Status: AC
  Filled 2015-10-07: qty 30

## 2015-10-07 MED ORDER — SODIUM CHLORIDE 0.9 % IV SOLN
INTRAVENOUS | Status: DC
  Administered 2015-10-07 – 2015-10-08 (×4): via INTRAVENOUS

## 2015-10-07 MED ORDER — LABETALOL HCL 5 MG/ML IV SOLN
INTRAVENOUS | Status: DC | PRN
  Administered 2015-10-07 (×2): 5 mg via INTRAVENOUS

## 2015-10-07 MED ORDER — FENTANYL CITRATE (PF) 100 MCG/2ML IJ SOLN
INTRAMUSCULAR | Status: DC | PRN
  Administered 2015-10-07 (×2): 50 ug via INTRAVENOUS
  Administered 2015-10-07: 100 ug via INTRAVENOUS

## 2015-10-07 MED ORDER — ONDANSETRON HCL 4 MG/2ML IJ SOLN
INTRAMUSCULAR | Status: DC | PRN
  Administered 2015-10-07: 4 mg via INTRAVENOUS

## 2015-10-07 MED ORDER — LIDOCAINE HCL (CARDIAC) 20 MG/ML IV SOLN
INTRAVENOUS | Status: DC | PRN
  Administered 2015-10-07: 60 mg via INTRAVENOUS

## 2015-10-07 MED ORDER — ACETAMINOPHEN 10 MG/ML IV SOLN
1000.0000 mg | Freq: Once | INTRAVENOUS | Status: AC
  Administered 2015-10-07: 1000 mg via INTRAVENOUS

## 2015-10-07 MED ORDER — MORPHINE SULFATE (PF) 2 MG/ML IV SOLN
2.0000 mg | INTRAVENOUS | Status: DC | PRN
  Administered 2015-10-07 – 2015-10-08 (×3): 2 mg via INTRAVENOUS
  Filled 2015-10-07 (×3): qty 1

## 2015-10-07 MED ORDER — GLYCOPYRROLATE 0.2 MG/ML IJ SOLN
INTRAMUSCULAR | Status: DC | PRN
  Administered 2015-10-07: 0.2 mg via INTRAVENOUS
  Administered 2015-10-07: 0.4 mg via INTRAVENOUS

## 2015-10-07 MED ORDER — PROMETHAZINE HCL 25 MG/ML IJ SOLN: 12.5000 mg | Freq: Four times a day (QID) | INTRAMUSCULAR | Status: DC | PRN

## 2015-10-07 MED ORDER — LIDOCAINE-EPINEPHRINE 1 %-1:100000 IJ SOLN
INTRAMUSCULAR | Status: AC
Start: 2015-10-07 — End: 2015-10-07
  Filled 2015-10-07: qty 1

## 2015-10-07 MED ORDER — ROCURONIUM BROMIDE 100 MG/10ML IV SOLN
INTRAVENOUS | Status: DC | PRN
  Administered 2015-10-07: 10 mg via INTRAVENOUS
  Administered 2015-10-07: 20 mg via INTRAVENOUS
  Administered 2015-10-07: 10 mg via INTRAVENOUS

## 2015-10-07 MED ORDER — DEXAMETHASONE SODIUM PHOSPHATE 10 MG/ML IJ SOLN
INTRAMUSCULAR | Status: DC | PRN
  Administered 2015-10-07: 5 mg via INTRAVENOUS

## 2015-10-07 MED ORDER — FENTANYL CITRATE (PF) 100 MCG/2ML IJ SOLN
INTRAMUSCULAR | Status: AC
  Administered 2015-10-07: 25 ug via INTRAVENOUS
  Filled 2015-10-07: qty 2

## 2015-10-07 MED ORDER — LACTATED RINGERS IV SOLN
INTRAVENOUS | Status: DC
  Administered 2015-10-07 (×2): via INTRAVENOUS

## 2015-10-07 MED ORDER — ONDANSETRON HCL 4 MG/2ML IJ SOLN: 4.0000 mg | Freq: Four times a day (QID) | INTRAMUSCULAR | Status: DC | PRN

## 2015-10-07 MED ORDER — DEXMEDETOMIDINE HCL IN NACL 200 MCG/50ML IV SOLN
INTRAVENOUS | Status: DC | PRN
  Administered 2015-10-07: 4 ug via INTRAVENOUS
  Administered 2015-10-07 (×2): 8 ug via INTRAVENOUS

## 2015-10-07 MED ORDER — PREMIER PROTEIN SHAKE: 2.0000 [oz_av] | ORAL | Status: DC

## 2015-10-07 SURGICAL SUPPLY — 45 items
APPLIER CLIP ROT 13.4 12 LRG (CLIP)
BANDAGE ELASTIC 6 LF NS (GAUZE/BANDAGES/DRESSINGS) ×4 IMPLANT
BLADE SURG SZ11 CARB STEEL (BLADE) ×2 IMPLANT
CANISTER SUCT 1200ML W/VALVE (MISCELLANEOUS) ×2 IMPLANT
CHLORAPREP W/TINT 26ML (MISCELLANEOUS) ×4 IMPLANT
CLIP APPLIE ROT 13.4 12 LRG (CLIP) IMPLANT
DECANTER SPIKE VIAL GLASS SM (MISCELLANEOUS) IMPLANT
DEFOGGER SCOPE WARMER CLEARIFY (MISCELLANEOUS) ×2 IMPLANT
DRAPE UTILITY 15X26 TOWEL STRL (DRAPES) ×4 IMPLANT
FILTER LAP SMOKE EVAC STRL (MISCELLANEOUS) ×2 IMPLANT
GLOVE BIO SURGEON STRL SZ8 (GLOVE) ×22 IMPLANT
GOWN STRL REUS W/ TWL LRG LVL3 (GOWN DISPOSABLE) ×4 IMPLANT
GOWN STRL REUS W/ TWL XL LVL3 (GOWN DISPOSABLE) ×2 IMPLANT
GOWN STRL REUS W/TWL LRG LVL3 (GOWN DISPOSABLE) ×4
GOWN STRL REUS W/TWL XL LVL3 (GOWN DISPOSABLE) ×2
IRRIGATION STRYKERFLOW (MISCELLANEOUS) IMPLANT
IRRIGATOR STRYKERFLOW (MISCELLANEOUS)
IV NS 1000ML (IV SOLUTION) ×1
IV NS 1000ML BAXH (IV SOLUTION) ×1 IMPLANT
KIT RM TURNOVER STRD PROC AR (KITS) ×2 IMPLANT
LABEL OR SOLS (LABEL) ×2 IMPLANT
LIQUID BAND (GAUZE/BANDAGES/DRESSINGS) ×2 IMPLANT
NDL INSUFF 14G 150MM VS150000 (NEEDLE) ×2 IMPLANT
NDL SAFETY 22GX1.5 (NEEDLE) ×2 IMPLANT
NS IRRIG 500ML POUR BTL (IV SOLUTION) ×2 IMPLANT
PACK LAP CHOLECYSTECTOMY (MISCELLANEOUS) ×2 IMPLANT
RELOAD GREEN (STAPLE) ×2 IMPLANT
RELOAD STAPLER GOLD 60MM (STAPLE) ×4 IMPLANT
SHEARS HARMONIC ACE PLUS 45CM (MISCELLANEOUS) ×2 IMPLANT
SLEEVE ENDOPATH XCEL 5M (ENDOMECHANICALS) ×4 IMPLANT
SLEEVE GASTRECTOMY 36FR VISIGI (MISCELLANEOUS) ×2 IMPLANT
STAPLER ECHELON BIOABSB 60 FLE (MISCELLANEOUS) ×8 IMPLANT
STAPLER ECHELON LONG 60 440 (INSTRUMENTS) ×2 IMPLANT
STAPLER RELOAD GOLD 60MM (STAPLE) ×8
SUT DEVICE BRAIDED 0X39 (SUTURE) ×4 IMPLANT
SUT VIC AB 3-0 SH 27 (SUTURE)
SUT VIC AB 3-0 SH 27X BRD (SUTURE) IMPLANT
SUT VIC AB 4-0 PS2 18 (SUTURE) ×4 IMPLANT
TROCAR BLADELESS 15MM (ENDOMECHANICALS) ×2 IMPLANT
TROCAR SL VERSASTEP 5M LG  B (MISCELLANEOUS) ×1
TROCAR SL VERSASTEP 5M LG B (MISCELLANEOUS) ×1 IMPLANT
TROCAR XCEL 12X100 BLDLESS (ENDOMECHANICALS) ×2 IMPLANT
TROCAR XCEL NON-BLD 5MMX100MML (ENDOMECHANICALS) ×2 IMPLANT
TUBING INSUFFLATOR HEATED (MISCELLANEOUS) ×2 IMPLANT
WATER STERILE IRR 1000ML POUR (IV SOLUTION) IMPLANT

## 2015-10-07 NOTE — Anesthesia Preprocedure Evaluation (Signed)
Anesthesia Evaluation  Patient identified by MRN, date of birth, ID band Patient awake    Reviewed: Allergy & Precautions, H&P , NPO status , Patient's Chart, lab work & pertinent test results, reviewed documented beta blocker date and time   Airway Mallampati: III  TM Distance: >3 FB Neck ROM: full    Dental  (+) Teeth Intact   Pulmonary neg pulmonary ROS,    Pulmonary exam normal        Cardiovascular hypertension, negative cardio ROS Normal cardiovascular exam Rhythm:regular Rate:Normal     Neuro/Psych  Headaches, PSYCHIATRIC DISORDERS negative neurological ROS  negative psych ROS   GI/Hepatic negative GI ROS, Neg liver ROS, GERD  Medicated,  Endo/Other  negative endocrine ROSMorbid obesity  Renal/GU negative Renal ROS  negative genitourinary   Musculoskeletal   Abdominal   Peds  Hematology negative hematology ROS (+) anemia ,   Anesthesia Other Findings Past Medical History: No date: Anemia No date: Depression No date: GERD (gastroesophageal reflux disease) 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 No date: Hypertension No date: Lyme disease No date: Migraines 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 [facility]) Past Surgical History: 06/25/2015: DILATATION & CURETTAGE/HYSTEROSCOPY WITH MYOSU* N/A     Comment: Procedure: DILATATION & CURETTAGE/HYSTEROSCOPY              WITH MYOSURE;  Surgeon: [name], DO;                Location: [facility];  Service: Gynecology;                Laterality: N/A; 06/25/2015: MYOMECTOMY N/A     Comment: Procedure: MYOMECTOMY;  Surgeon: [name], DO;  Location: [facility];  Service:               Gynecology;  Laterality: N/A;  Myosure               1st Abdominal Myomectomy 2nd No date: THERAPEUTIC ABORTION No date: WISDOM TOOTH EXTRACTION BMI    Body Mass Index:  40.34 kg/m     Reproductive/Obstetrics negative OB ROS                             Anesthesia  Physical Anesthesia Plan  ASA: III  Anesthesia Plan: General ETT   Post-op Pain Management:    Induction:   Airway Management Planned:   Additional Equipment:   Intra-op Plan:   Post-operative Plan:   Informed Consent: I have reviewed the patients History and Physical, chart, labs and discussed the procedure including the risks, benefits and alternatives for the proposed anesthesia with the patient or authorized representative who has indicated his/her understanding and acceptance.   Dental Advisory Given  Plan Discussed with: CRNA  Anesthesia Plan Comments:         Anesthesia Quick Evaluation

## 2015-10-07 NOTE — Progress Notes (Signed)
Anticoagulation monitoring(Lovenox):  30 yo  female ordered Lovenox 40 mg Q24h  Filed Weights   10/07/15 1138  Weight: 235 lb (106.6 kg)   BMI 40.4   Lab Results  Component Value Date   CREATININE 0.85 10/03/2015   CREATININE 0.81 06/27/2015   CREATININE 1.02 (H) 06/26/2015   Estimated Creatinine Clearance: 113.3 mL/min (by C-G formula based on SCr of 0.85 mg/dL). Hemoglobin & Hematocrit     Component Value Date/Time   HGB 11.9 (L) 10/03/2015 1503   HCT 36.0 10/03/2015 1503     Per Protocol for Patient with estCrcl > 30 ml/min and BMI > 40, will transition to Lovenox 40 mg Q12h.

## 2015-10-07 NOTE — Progress Notes (Signed)
Per Dr. Darnell Level place order for normal saline at 155ml/ hr as fluid order has discontinued

## 2015-10-07 NOTE — Anesthesia Procedure Notes (Signed)
Procedure Name: Intubation Date/Time: 10/07/2015 1:04 PM Performed by: Naomie Dean Pre-anesthesia Checklist: Patient identified, Emergency Drugs available, Suction available, Patient being monitored and Timeout performed Patient Re-evaluated:Patient Re-evaluated prior to inductionOxygen Delivery Method: Circle system utilized Preoxygenation: Pre-oxygenation with 100% oxygen Intubation Type: IV induction Ventilation: Mask ventilation without difficulty Laryngoscope Size: Mac and 3 Grade View: Grade I Tube type: Oral Tube size: 7.0 mm Number of attempts: 1

## 2015-10-07 NOTE — Transfer of Care (Signed)
Immediate Anesthesia Transfer of Care Note  Patient: Ruth Hill  Procedure(s) Performed: Procedure(s): LAPAROSCOPIC GASTRIC SLEEVE RESECTION (N/A)  Patient Location: PACU  Anesthesia Type:General  Level of Consciousness: sedated  Airway & Oxygen Therapy: Patient Spontanous Breathing  Post-op Assessment: Report given to RN  Post vital signs: stable  Last Vitals:  Vitals:   10/07/15 1138  BP: (!) 138/94  Pulse: 84  Resp: 16  Temp: 36.9 C    Last Pain:  Vitals:   10/07/15 1138  TempSrc: Oral         Complications: No apparent anesthesia complications

## 2015-10-07 NOTE — Anesthesia Postprocedure Evaluation (Deleted)
Anesthesia Post Note  Patient: Ruth Hill  Procedure(s) Performed: Procedure(s) (LRB): LAPAROSCOPIC GASTRIC SLEEVE RESECTION (N/A)  Patient location during evaluation: PACU Anesthesia Type: General Level of consciousness: awake and alert Pain management: pain level controlled Vital Signs Assessment: post-procedure vital signs reviewed and stable Respiratory status: spontaneous breathing, nonlabored ventilation, respiratory function stable and patient connected to nasal cannula oxygen Cardiovascular status: blood pressure returned to baseline and stable Postop Assessment: no signs of nausea or vomiting Anesthetic complications: no    Last Vitals:  Vitals:   10/07/15 1138  BP: (!) 138/94  Pulse: 84  Resp: 16  Temp: 36.9 C    Last Pain:  Vitals:   10/07/15 1138  TempSrc: Oral                 Molli Barrows

## 2015-10-07 NOTE — Anesthesia Postprocedure Evaluation (Signed)
Anesthesia Post Note  Patient: Ruth Hill  Procedure(s) Performed: Procedure(s) (LRB): LAPAROSCOPIC GASTRIC SLEEVE RESECTION (N/A)  Patient location during evaluation: PACU Anesthesia Type: General Level of consciousness: awake and alert Pain management: pain level controlled Vital Signs Assessment: post-procedure vital signs reviewed and stable Respiratory status: spontaneous breathing, nonlabored ventilation, respiratory function stable and patient connected to nasal cannula oxygen Cardiovascular status: blood pressure returned to baseline and stable Postop Assessment: no signs of nausea or vomiting Anesthetic complications: no    Last Vitals:  Vitals:   10/07/15 1630 10/07/15 1721  BP: 127/80 136/78  Pulse: 66 71  Resp:    Temp:      Last Pain:  Vitals:   10/07/15 1806  TempSrc:   PainSc: 3                  Molli Barrows

## 2015-10-07 NOTE — H&P (Signed)
See scanned document.

## 2015-10-07 NOTE — Op Note (Signed)
   Pre-op dx: morbid obesity Post-op dx: same Procedure: lap sleeve gastrectomy Surgeon: Darnell Level AsstLorenda Cahill EBL: none Anesth: GETA Specimen: portion of stomach Bougie: 41 Fr Distance from pylorus: 5 cm  Details of procedure: The patient was taken to the operating room and placed on the operating table in the supine position. Timeout was performed. SCD's were placed. The patient was placed under general anesthesia without incident. Broad spectrum antibiotics were given. A 36 french bougie was placed.  The abdomen was prepped and draped in the usual fashion. It was accessed using a 5 mm optical trocar in the left upper quadrant. Pneumoperitoneum was established without incident. Multiple other trocars were placed in preparation for the procedure. A liver retractor was placed.   The hiatus was explored. No significant hiatal hernia was present. The greater curve was mobilized 5 cm from the pylorus using the harmonic scalpel. This continued dividing the short gastrics and mobilizing the fundus off the left hemidiaphragm. The posterior lesser sac adhesions were divided as well. The Visi-G was advanced to the antrum and the antrum was bisected with an Echelon 60 mm green load stapler. All staple loads had buttressing material. The remainder of the sleeve was created by firing gold load staplers through the left-sided 12 mm port parallel to the lesser curve leaving a small tail at the angle of His. No bleeding or leakage was present at the staple line.  The divided gastric tissue was removed without spillage and sent to pathology. The trocars and liver retractor were removed without incident.The wounds were closed with 4-0 Vicryl and dermabond.The patient arrived at the recovery room in stable condition.

## 2015-10-08 ENCOUNTER — Encounter: Payer: Self-pay | Admitting: Specialist

## 2015-10-08 LAB — CBC WITH DIFFERENTIAL/PLATELET
BASOS ABS: 0.1 10*3/uL (ref 0–0.1)
BASOS PCT: 1 %
EOS PCT: 0 %
Eosinophils Absolute: 0 10*3/uL (ref 0–0.7)
HCT: 32.4 % — ABNORMAL LOW (ref 35.0–47.0)
Hemoglobin: 10.9 g/dL — ABNORMAL LOW (ref 12.0–16.0)
LYMPHS PCT: 7 %
Lymphs Abs: 0.8 10*3/uL — ABNORMAL LOW (ref 1.0–3.6)
MCH: 28.1 pg (ref 26.0–34.0)
MCHC: 33.7 g/dL (ref 32.0–36.0)
MCV: 83.5 fL (ref 80.0–100.0)
Monocytes Absolute: 0.4 10*3/uL (ref 0.2–0.9)
Monocytes Relative: 4 %
Neutro Abs: 10.1 10*3/uL — ABNORMAL HIGH (ref 1.4–6.5)
Neutrophils Relative %: 88 %
PLATELETS: 254 10*3/uL (ref 150–440)
RBC: 3.88 MIL/uL (ref 3.80–5.20)
RDW: 14.5 % (ref 11.5–14.5)
WBC: 11.4 10*3/uL — AB (ref 3.6–11.0)

## 2015-10-08 MED ORDER — HYDROCODONE-ACETAMINOPHEN 7.5-325 MG/15ML PO SOLN
15.0000 mL | ORAL | Status: DC | PRN
  Administered 2015-10-08: 15 mL via ORAL
  Filled 2015-10-08: qty 15

## 2015-10-08 NOTE — Discharge Summary (Signed)
Physician Discharge Summary  Patient ID: Ruth Hill MRN: [id number] DOB/AGE: 32-Oct-1987 32 y.o.  Admit date: 10/07/2015 Discharge date: 10/08/2015  Admission Diagnoses:Morbid Obesity  Discharge Diagnoses:  Active Problems:   Morbid obesity ([place])   Discharged Condition: good  Hospital Course: Unremarkable. Pt had good post op course. Some nausea post op day one, but it has resolved. Drinking well and ambulating. Pain controlled. DC home  Consults: None  Significant Diagnostic Studies: labs: see chart  Treatments: IV hydration, antibiotics: mefoxin, analgesia: acetaminophen, Dilaudid and Hycet, anticoagulation: Lovenox and surgery: lap Gastric sleeve  Discharge Exam: Blood pressure 134/73, pulse 66, temperature 98.4 F (36.9 C), temperature source Oral, resp. rate 18, height 5\' 4"  (1.626 m), weight 106.6 kg (235 lb), last menstrual period 09/22/2015, SpO2 100 %.   Disposition: 01-Home or Self Care  Discharge Instructions    Ambulate hourly while awake    Complete by:  As directed   Call MD for:  difficulty breathing, headache or visual disturbances    Complete by:  As directed   Call MD for:  persistant dizziness or light-headedness    Complete by:  As directed   Call MD for:  persistant nausea and vomiting    Complete by:  As directed   Call MD for:  redness, tenderness, or signs of infection (pain, swelling, redness, odor or green/yellow discharge around incision site)    Complete by:  As directed   Call MD for:  severe uncontrolled pain    Complete by:  As directed   Call MD for:  temperature >101 F    Complete by:  As directed   Diet bariatric full liquid    Complete by:  As directed   Incentive spirometry    Complete by:  As directed   Perform hourly while awake       Medication List    STOP taking these medications   diclofenac 25 MG EC tablet Commonly known as:  VOLTAREN   multivitamin with minerals Tabs tablet     TAKE these medications   lisinopril 20 MG  tablet Commonly known as:  PRINIVIL,ZESTRIL Take 20 mg by mouth daily.   NORA-BE 0.35 MG tablet Generic drug:  norethindrone Take 1 tablet by mouth daily.   ranitidine 150 MG tablet Commonly known as:  ZANTAC Take 150 mg by mouth daily as needed for heartburn.   solifenacin 10 MG tablet Commonly known as:  VESICARE Take 10 mg by mouth daily.   valACYclovir 500 MG tablet Commonly known as:  VALTREX Take 500 mg by mouth 2 (two) times daily as needed (For outbreak.).        Signed: Melissa Noon 10/08/2015, 3:01 PM This is transcribed for [facility]

## 2015-10-08 NOTE — Progress Notes (Signed)
Spoke to PA for Dr. Velta Addison okay to place order for Hycet 7.5/325 q 4hours PRN. Discontinue order for oxycodone and acetaminophen

## 2015-10-08 NOTE — Progress Notes (Signed)
Pt A and O x 4. VSS. Pt tolerating diet well. No complaints of pain or nausea. IV removed intact, prescriptions given. Pt voiced understanding of discharge instructions with no further questions. Pt discharged via wheelchair with axillary.   

## 2015-10-08 NOTE — Progress Notes (Signed)
INTERVENTION:  RD consulted for nutrition education regarding inpatient bariatric surgery.   RD provided "The Liquid Diet" handout from the Bariatric Surgery Guide from the [facility]. This handout previously provided to patient prior to surgery is a duplicate copy. Discussed what foods/liquids are consistent with a Clear Liquid Diet and reinforced Key Concepts such as no carbonation, no caffeine, or sugar containing beverages. Reiterated fluid and protein goals. Provided methods to prevent dehydration and promote protein intake, using clock and sample fluid schedule. RD encouraged follow-up with outpatient dietitian after discharge.  Teach back method used.  Expect good compliance.  NUTRITION DIAGNOSIS:  Food and nutrition knowledge related deficit related to recent bariatric surgery as evidenced by dietitian consult for nutrition education   GOAL:  Patient will be able to sip and tolerate CL within 24-48 hours  MONITOR:  Energy intake Digestive system  ASSESSMENT:  Pt s/p lap gastric sleeve resection.   Body mass index is 40.34 kg/m. Pt meets criteria for morbid obesity based on current BMI.  Current diet order is Bariatric CL. Pt tolerating sips of CL diet this AM, no vomiting but c/o some nausea. Requesting zofran, Abbie RN notified.   Labs and medications reviewed.    Kerman Passey [place], [place], LDN [phone] Pager  [phone] Weekend/On-Call Pager

## 2015-10-09 LAB — SURGICAL PATHOLOGY

## 2015-10-12 ENCOUNTER — Encounter (HOSPITAL_BASED_OUTPATIENT_CLINIC_OR_DEPARTMENT_OTHER): Payer: Self-pay | Admitting: *Deleted

## 2015-10-12 ENCOUNTER — Emergency Department (HOSPITAL_BASED_OUTPATIENT_CLINIC_OR_DEPARTMENT_OTHER)
Admission: EM | Admit: 2015-10-12 | Discharge: 2015-10-12 | Disposition: A | Discharge status: Home / Self Care | Payer: Managed Care, Other (non HMO) | Attending: Emergency Medicine | Admitting: Emergency Medicine

## 2015-10-12 ENCOUNTER — Emergency Department (HOSPITAL_BASED_OUTPATIENT_CLINIC_OR_DEPARTMENT_OTHER): Payer: Managed Care, Other (non HMO)

## 2015-10-12 DIAGNOSIS — R008 Other abnormalities of heart beat: Secondary | ICD-10-CM | POA: Diagnosis present

## 2015-10-12 DIAGNOSIS — I10 Essential (primary) hypertension: Secondary | ICD-10-CM | POA: Diagnosis not present

## 2015-10-12 DIAGNOSIS — Z79899 Other long term (current) drug therapy: Secondary | ICD-10-CM | POA: Diagnosis not present

## 2015-10-12 DIAGNOSIS — R109 Unspecified abdominal pain: Secondary | ICD-10-CM | POA: Insufficient documentation

## 2015-10-12 DIAGNOSIS — R002 Palpitations: Secondary | ICD-10-CM

## 2015-10-12 LAB — BASIC METABOLIC PANEL
Anion gap: 9 (ref 5–15)
BUN: 13 mg/dL (ref 6–20)
CALCIUM: 9.5 mg/dL (ref 8.9–10.3)
CO2: 24 mmol/L (ref 22–32)
CREATININE: 1.04 mg/dL — AB (ref 0.44–1.00)
Chloride: 102 mmol/L (ref 101–111)
Glucose, Bld: 81 mg/dL (ref 65–99)
Potassium: 3.3 mmol/L — ABNORMAL LOW (ref 3.5–5.1)
SODIUM: 135 mmol/L (ref 135–145)

## 2015-10-12 LAB — CBC WITH DIFFERENTIAL/PLATELET
Basophils Absolute: 0 10*3/uL (ref 0.0–0.1)
Basophils Relative: 0 %
EOS ABS: 0 10*3/uL (ref 0.0–0.7)
EOS PCT: 1 %
HCT: 34.6 % — ABNORMAL LOW (ref 36.0–46.0)
HEMOGLOBIN: 11.6 g/dL — AB (ref 12.0–15.0)
LYMPHS ABS: 1.6 10*3/uL (ref 0.7–4.0)
Lymphocytes Relative: 24 %
MCH: 27.8 pg (ref 26.0–34.0)
MCHC: 33.5 g/dL (ref 30.0–36.0)
MCV: 83 fL (ref 78.0–100.0)
MONOS PCT: 8 %
Monocytes Absolute: 0.5 10*3/uL (ref 0.1–1.0)
NEUTROS ABS: 4.4 10*3/uL (ref 1.7–7.7)
NEUTROS PCT: 67 %
PLATELETS: 295 10*3/uL (ref 150–400)
RBC: 4.17 MIL/uL (ref 3.87–5.11)
RDW: 13.4 % (ref 11.5–15.5)
WBC: 6.6 10*3/uL (ref 4.0–10.5)

## 2015-10-12 NOTE — ED Triage Notes (Signed)
Patient c/o rapid heartbeat that lasted most of Friday & yesterday. She took valium yesterday and states it helped. She thinks she was dehydrated and has been trying to drink more water. She had a migraine Thursday & Friday and took medicine that relieved her headache.  She had a gastric sleeve placed on Tuesday Sept 5. When she had the chest pain, it was Right side and central chest.

## 2015-10-12 NOTE — ED Provider Notes (Signed)
[facility] DEPT [facility] Provider Note   CSN: [id number] Arrival date & time: 10/12/15  0753     History   Chief Complaint Chief Complaint  Patient presents with  . Irregular Heart Beat    HPI Ruth Hill is a 30 y.o. female.  The history is provided by the patient.  Patient presents with palpitations. States she feels her heart beating hard at times. Does not go fast. Is not shortness of breath. She had a gastric sleeve done laparoscopically earlier this week. No swelling or legs. States she may have been eating and drinking a little less. She has a little worried she is dehydrated. No fevers or chills. No cough. No chest pain. States the symptoms will go away at times. States she did have a dull headache. States she thought it may have been anxiety and took  Valium. She has had the symptoms for the last day.   Past Medical History:  Diagnosis Date  . Anemia   . Depression   . GERD (gastroesophageal reflux disease)   . HSV infection   . Hypertension   . Lyme disease   . Migraines   . Sjoegren syndrome [facility])     Patient Active Problem List   Diagnosis Date Noted  . Morbid obesity ([place]) 10/07/2015  . Uterine leiomyoma 06/25/2015    Past Surgical History:  Procedure Laterality Date  . DILATATION & CURETTAGE/HYSTEROSCOPY WITH MYOSURE N/A 06/25/2015   Procedure: DILATATION & CURETTAGE/HYSTEROSCOPY WITH MYOSURE;  Surgeon: [name], DO;  Location: [facility];  Service: Gynecology;  Laterality: N/A;  . LAPAROSCOPIC GASTRIC SLEEVE RESECTION N/A 10/07/2015   Procedure: LAPAROSCOPIC GASTRIC SLEEVE RESECTION;  Surgeon: Bonner Puna, MD;  Location: [facility];  Service: General;  Laterality: N/A;  . MYOMECTOMY N/A 06/25/2015   Procedure: MYOMECTOMY;  Surgeon: [name], DO;  Location: [facility];  Service: Gynecology;  Laterality: N/A;  Myosure 1st Abdominal Myomectomy 2nd  . THERAPEUTIC ABORTION    . WISDOM TOOTH EXTRACTION      OB History    No data available       Home  Medications    Prior to Admission medications   Medication Sig Start Date End Date Taking? Authorizing Provider  Enoxaparin Sodium (LOVENOX [place]) Inject into the skin.   Yes Historical Provider, MD  lisinopril (PRINIVIL,ZESTRIL) 20 MG tablet Take 20 mg by mouth daily.    Historical Provider, MD  norethindrone (NORA-BE) 0.35 MG tablet Take 1 tablet by mouth daily.    Historical Provider, MD  ranitidine (ZANTAC) 150 MG tablet Take 150 mg by mouth daily as needed for heartburn.    Historical Provider, MD  solifenacin (VESICARE) 10 MG tablet Take 10 mg by mouth 3 times/day as needed-between meals & bedtime.     Historical Provider, MD  valACYclovir (VALTREX) 500 MG tablet Take 500 mg by mouth 2 (two) times daily as needed (For outbreak.).    Historical Provider, MD    Family History Family History  Problem Relation Age of Onset  . Hypertension Mother   . Hypertension Father     Social History Social History  Substance Use Topics  . Smoking status: Never Smoker  . Smokeless tobacco: Never Used  . Alcohol use No     Comment: social     Allergies   Review of patient's allergies indicates no known allergies.   Review of Systems Review of Systems  Constitutional: Negative for activity change, appetite change and fever.  Eyes: Negative for pain.  Respiratory: Negative for chest tightness and shortness of breath.   Cardiovascular: Positive for palpitations. Negative for chest pain and leg swelling.  Gastrointestinal: Positive for abdominal pain. Negative for diarrhea, nausea and vomiting.  Genitourinary: Negative for flank pain.  Musculoskeletal: Negative for back pain and neck stiffness.  Skin: Negative for rash.  Neurological: Negative for weakness, numbness and headaches.  Psychiatric/Behavioral: Negative for behavioral problems.     Physical Exam Updated Vital Signs BP 106/78   Pulse 63   Temp 98.6 F (37 C) (Oral)   Resp 12   LMP 09/22/2015   SpO2 99%   Physical Exam   Constitutional: She appears well-developed.  HENT:  Head: Atraumatic.  Neck: Neck supple. No JVD present.  Cardiovascular: Normal rate, regular rhythm and normal heart sounds.   Pulmonary/Chest: Effort normal.  Abdominal:  Mild abdominal tenderness. Well-healing port sites.  Musculoskeletal: She exhibits no edema.  Neurological: She is alert.  Skin: Skin is warm.  Psychiatric: She has a normal mood and affect.     ED Treatments / Results  Labs (all labs ordered are listed, but only abnormal results are displayed) Labs Reviewed  BASIC METABOLIC PANEL - Abnormal; Notable for the following:       Result Value   Potassium 3.3 (*)    Creatinine, Ser 1.04 (*)    All other components within normal limits  CBC WITH DIFFERENTIAL/PLATELET - Abnormal; Notable for the following:    Hemoglobin 11.6 (*)    HCT 34.6 (*)    All other components within normal limits    EKG  EKG Interpretation  Date/Time:  Sunday October 12 2015 08:12:18 EDT Ventricular Rate:  93 PR Interval:    QRS Duration: 84 QT Interval:  376 QTC Calculation: 468 R Axis:   24 Text Interpretation:  Sinus rhythm Confirmed by Alvino Chapel  MD, Ovid Curd [phone]) on 10/12/2015 9:22:34 AM       Radiology Dg Chest 2 View  Result Date: 10/12/2015 CLINICAL DATA:  Rapid heart rate and right-sided central chest pain for 2 days. EXAM: CHEST  2 VIEW COMPARISON:  03/18/2015 FINDINGS: The heart size and mediastinal contours are within normal limits. Both lungs are clear. The visualized skeletal structures are unremarkable. IMPRESSION: No active cardiopulmonary disease. Electronically Signed   By: Misty Stanley M.D.   On: 10/12/2015 09:30    Procedures Procedures (including critical care time)  Medications Ordered in ED Medications - No data to display   Initial Impression / Assessment and Plan / ED Course  I have reviewed the triage vital signs and the nursing notes.  Pertinent labs & imaging results that were available  during my care of the patient were reviewed by me and considered in my medical decision making (see chart for details).  Clinical Course  Patient with palpitations. EKG reassuring. Mild hypokalemia. May have a component of mild dehydration. Will increase oral intake. EKG and labs reassuring. Creatinine minimally elevated. Discharge home to follow-up as needed. Pulmonary embolism felt less likely. Doubt cardiac ischemia as a cause. Final diagnoses:  Palpitations    New Prescriptions New Prescriptions   No medications on file     Davonna Belling, MD 10/12/15 [phone]

## 2015-11-30 ENCOUNTER — Encounter (HOSPITAL_BASED_OUTPATIENT_CLINIC_OR_DEPARTMENT_OTHER): Payer: Self-pay | Admitting: *Deleted

## 2015-11-30 ENCOUNTER — Emergency Department (HOSPITAL_BASED_OUTPATIENT_CLINIC_OR_DEPARTMENT_OTHER)
Admission: EM | Admit: 2015-11-30 | Discharge: 2015-11-30 | Disposition: A | Discharge status: Home / Self Care | Payer: Managed Care, Other (non HMO) | Attending: Emergency Medicine | Admitting: Emergency Medicine

## 2015-11-30 ENCOUNTER — Emergency Department (HOSPITAL_BASED_OUTPATIENT_CLINIC_OR_DEPARTMENT_OTHER): Payer: Managed Care, Other (non HMO)

## 2015-11-30 DIAGNOSIS — I10 Essential (primary) hypertension: Secondary | ICD-10-CM | POA: Insufficient documentation

## 2015-11-30 DIAGNOSIS — Z79899 Other long term (current) drug therapy: Secondary | ICD-10-CM | POA: Insufficient documentation

## 2015-11-30 DIAGNOSIS — R1032 Left lower quadrant pain: Secondary | ICD-10-CM | POA: Diagnosis present

## 2015-11-30 DIAGNOSIS — R102 Pelvic and perineal pain: Secondary | ICD-10-CM | POA: Insufficient documentation

## 2015-11-30 LAB — BASIC METABOLIC PANEL
Anion gap: 7 (ref 5–15)
BUN: 9 mg/dL (ref 6–20)
CHLORIDE: 106 mmol/L (ref 101–111)
CO2: 24 mmol/L (ref 22–32)
Calcium: 9.4 mg/dL (ref 8.9–10.3)
Creatinine, Ser: 0.76 mg/dL (ref 0.44–1.00)
GFR calc Af Amer: >60 mL/min (ref >60)
GFR calc non Af Amer: >60 mL/min (ref >60)
GLUCOSE: 78 mg/dL (ref 65–99)
POTASSIUM: 3.5 mmol/L (ref 3.5–5.1)
Sodium: 137 mmol/L (ref 135–145)

## 2015-11-30 LAB — URINALYSIS, ROUTINE W REFLEX MICROSCOPIC
Glucose, UA: NEGATIVE mg/dL
HGB URINE DIPSTICK: NEGATIVE
Leukocytes, UA: NEGATIVE
Nitrite: NEGATIVE
Protein, ur: NEGATIVE mg/dL
SPECIFIC GRAVITY, URINE: 1.03 (ref 1.005–1.030)
pH: 5.5 (ref 5.0–8.0)

## 2015-11-30 LAB — WET PREP, GENITAL
Clue Cells Wet Prep HPF POC: NONE SEEN
Sperm: NONE SEEN
Trich, Wet Prep: NONE SEEN
Yeast Wet Prep HPF POC: NONE SEEN

## 2015-11-30 LAB — CBC WITH DIFFERENTIAL/PLATELET
Basophils Absolute: 0 10*3/uL (ref 0.0–0.1)
Basophils Relative: 0 %
EOS PCT: 1 %
Eosinophils Absolute: 0 10*3/uL (ref 0.0–0.7)
HCT: 31.7 % — ABNORMAL LOW (ref 36.0–46.0)
Hemoglobin: 10.4 g/dL — ABNORMAL LOW (ref 12.0–15.0)
LYMPHS ABS: 1.7 10*3/uL (ref 0.7–4.0)
LYMPHS PCT: 35 %
MCH: 27.8 pg (ref 26.0–34.0)
MCHC: 32.8 g/dL (ref 30.0–36.0)
MCV: 84.8 fL (ref 78.0–100.0)
MONO ABS: 0.4 10*3/uL (ref 0.1–1.0)
Monocytes Relative: 8 %
Neutro Abs: 2.6 10*3/uL (ref 1.7–7.7)
Neutrophils Relative %: 56 %
Platelets: 274 10*3/uL (ref 150–400)
RBC: 3.74 MIL/uL — ABNORMAL LOW (ref 3.87–5.11)
RDW: 15.1 % (ref 11.5–15.5)
WBC: 4.7 10*3/uL (ref 4.0–10.5)

## 2015-11-30 LAB — PREGNANCY, URINE: PREG TEST UR: NEGATIVE

## 2015-11-30 MED ORDER — ACETAMINOPHEN 500 MG PO TABS
1000.0000 mg | ORAL_TABLET | Freq: Once | ORAL | Status: AC
  Administered 2015-11-30: 1000 mg via ORAL
  Filled 2015-11-30: qty 2

## 2015-11-30 NOTE — ED Notes (Signed)
Patient transported to Ultrasound 

## 2015-11-30 NOTE — ED Provider Notes (Signed)
[facility] DEPT [facility] Provider Note   CSN: [id number] Arrival date & time: 11/30/15  [id number]     History   Chief Complaint Chief Complaint  Patient presents with  . Abdominal Pain    HPI Ruth Hill is a 30 y.o. female.  HPI   Patient with hx ovarian cyst, fibroids, recent IUD placement, gastric sleeve (surgery 10/2015) presents with left lower quadrant abdominal pain that began 3 days ago as a sudden sharp pain that was constant through the night, worse with movement.  The next morning and since the pain has been dull.  Harder to have a bowel movement this morning than normal.  Denies fevers, chills, myalgias, urinary symptoms, abnormal vaginal discharge, diarrhea or bloody stool.    Past Medical History:  Diagnosis Date  . Anemia   . Depression   . GERD (gastroesophageal reflux disease)   . HSV infection   . Hypertension   . Lyme disease   . Migraines   . Sjoegren syndrome [facility])     Patient Active Problem List   Diagnosis Date Noted  . Morbid obesity ([place]) 10/07/2015  . Uterine leiomyoma 06/25/2015    Past Surgical History:  Procedure Laterality Date  . DILATATION & CURETTAGE/HYSTEROSCOPY WITH MYOSURE N/A 06/25/2015   Procedure: DILATATION & CURETTAGE/HYSTEROSCOPY WITH MYOSURE;  Surgeon: [name], DO;  Location: [facility];  Service: Gynecology;  Laterality: N/A;  . LAPAROSCOPIC GASTRIC SLEEVE RESECTION N/A 10/07/2015   Procedure: LAPAROSCOPIC GASTRIC SLEEVE RESECTION;  Surgeon: Bonner Puna, MD;  Location: [facility];  Service: General;  Laterality: N/A;  . MYOMECTOMY N/A 06/25/2015   Procedure: MYOMECTOMY;  Surgeon: [name], DO;  Location: [facility];  Service: Gynecology;  Laterality: N/A;  Myosure 1st Abdominal Myomectomy 2nd  . THERAPEUTIC ABORTION    . WISDOM TOOTH EXTRACTION      OB History    No data available       Home Medications    Prior to Admission medications   Medication Sig Start Date End Date Taking? Authorizing Provider  lisinopril  (PRINIVIL,ZESTRIL) 20 MG tablet Take 20 mg by mouth daily.    Historical Provider, MD  norethindrone (NORA-BE) 0.35 MG tablet Take 1 tablet by mouth daily.    Historical Provider, MD  ranitidine (ZANTAC) 150 MG tablet Take 150 mg by mouth daily as needed for heartburn.    Historical Provider, MD  solifenacin (VESICARE) 10 MG tablet Take 10 mg by mouth 3 times/day as needed-between meals & bedtime.     Historical Provider, MD  valACYclovir (VALTREX) 500 MG tablet Take 500 mg by mouth 2 (two) times daily as needed (For outbreak.).    Historical Provider, MD    Family History Family History  Problem Relation Age of Onset  . Hypertension Mother   . Hypertension Father     Social History Social History  Substance Use Topics  . Smoking status: Never Smoker  . Smokeless tobacco: Never Used  . Alcohol use No     Comment: social     Allergies   Review of patient's allergies indicates no known allergies.   Review of Systems Review of Systems  All other systems reviewed and are negative.    Physical Exam Updated Vital Signs BP 108/69 (BP Location: Left Arm)   Pulse (!) 58   Temp 98.7 F (37.1 C) (Oral)   Resp 16   Ht 5\' 5"  (1.651 m)   Wt 92.5 kg   SpO2 100%   BMI 33.95 kg/m  Physical Exam  Constitutional: She appears well-developed and well-nourished. No distress.  HENT:  Head: Normocephalic and atraumatic.  Neck: Neck supple.  Cardiovascular: Normal rate and regular rhythm.   Pulmonary/Chest: Effort normal and breath sounds normal. No respiratory distress. She has no wheezes. She has no rales.  Abdominal: Soft. She exhibits no distension. There is tenderness. There is no rebound and no guarding.    Genitourinary: Uterus is tender. Cervix exhibits no motion tenderness. Right adnexum displays no mass, no tenderness and no fullness. Left adnexum displays tenderness. Left adnexum displays no mass and no fullness. No erythema or bleeding in the vagina. No foreign body in  the vagina.  Genitourinary Comments: Small amount of thin yellow discharge in vagina.  IUD string coming through os.  Midline and left sided tenderness on bimanual exam.    Neurological: She is alert.  Skin: She is not diaphoretic.  Nursing note and vitals reviewed.    ED Treatments / Results  Labs (all labs ordered are listed, but only abnormal results are displayed) Labs Reviewed  WET PREP, GENITAL - Abnormal; Notable for the following:       Result Value   WBC, Wet Prep HPF POC FEW (*)    All other components within normal limits  URINALYSIS, ROUTINE W REFLEX MICROSCOPIC (NOT AT [facility]) - Abnormal; Notable for the following:    APPearance CLOUDY (*)    Bilirubin Urine SMALL (*)    Ketones, ur >80 (*)    All other components within normal limits  CBC WITH DIFFERENTIAL/PLATELET - Abnormal; Notable for the following:    RBC 3.74 (*)    Hemoglobin 10.4 (*)    HCT 31.7 (*)    All other components within normal limits  PREGNANCY, URINE  BASIC METABOLIC PANEL  RPR  HIV ANTIBODY (ROUTINE TESTING)  GC/CHLAMYDIA PROBE AMP ([place]) NOT AT [facility]    EKG  EKG Interpretation None       Radiology US Transvaginal Non-ob  Result Date: 11/30/2015 CLINICAL DATA:  Left lower quadrant abdominal pain for the past 3 days. Intrauterine device placed 1 month ago. History of uterine fibroids and myomectomy in May 2017. EXAM: TRANSABDOMINAL AND TRANSVAGINAL ULTRASOUND OF PELVIS DOPPLER ULTRASOUND OF OVARIES TECHNIQUE: Both transabdominal and transvaginal ultrasound examinations of the pelvis were performed. Transabdominal technique was performed for global imaging of the pelvis including uterus, ovaries, adnexal regions, and pelvic cul-de-sac. It was necessary to proceed with endovaginal exam following the transabdominal exam to visualize the uterus, endometrium and ovaries in better detail. Color and duplex Doppler ultrasound was utilized to evaluate blood flow to the ovaries. COMPARISON:   None. FINDINGS: Uterus Measurements: 9.1 x 5.9 x 4.4 cm. 1.2 x 1.0 x 0.9 cm oval, mildly heterogeneous mass in the uterine fundus in the region of the fundal portion of the endometrium. This has an echotexture similar to the adjacent myometrium. There is also a 1.6 x 1.4 x 1.3 cm oval, less well-defined, mildly hypoechoic mass in the myometrium posterior to the endometrial stripe and abutting the stripe in the mid uterus. Endometrium Thickness: 5.5 mm.  Intrauterine device in expected position. Right ovary Measurements: 3.4 x 1.9 x 1.6 cm. Normal appearance/no adnexal mass. Left ovary Measurements: 2.9 x 2.9 x 2.3 cm. Normal appearance/no adnexal mass. Pulsed Doppler evaluation of both ovaries demonstrates normal low-resistance arterial and venous waveforms. Other findings No abnormal free fluid. IMPRESSION: 1. 1.2 cm submucosal fibroid or endometrial polyp in the uterine fundus. 2. 1.6 cm submucosal fibroid in  the mid uterus. 3. No acute abnormality. Electronically Signed   By: Claudie Revering M.D.   On: 11/30/2015 12:29   US Pelvis Complete  Result Date: 11/30/2015 CLINICAL DATA:  Left lower quadrant abdominal pain for the past 3 days. Intrauterine device placed 1 month ago. History of uterine fibroids and myomectomy in May 2017. EXAM: TRANSABDOMINAL AND TRANSVAGINAL ULTRASOUND OF PELVIS DOPPLER ULTRASOUND OF OVARIES TECHNIQUE: Both transabdominal and transvaginal ultrasound examinations of the pelvis were performed. Transabdominal technique was performed for global imaging of the pelvis including uterus, ovaries, adnexal regions, and pelvic cul-de-sac. It was necessary to proceed with endovaginal exam following the transabdominal exam to visualize the uterus, endometrium and ovaries in better detail. Color and duplex Doppler ultrasound was utilized to evaluate blood flow to the ovaries. COMPARISON:  None. FINDINGS: Uterus Measurements: 9.1 x 5.9 x 4.4 cm. 1.2 x 1.0 x 0.9 cm oval, mildly heterogeneous mass in  the uterine fundus in the region of the fundal portion of the endometrium. This has an echotexture similar to the adjacent myometrium. There is also a 1.6 x 1.4 x 1.3 cm oval, less well-defined, mildly hypoechoic mass in the myometrium posterior to the endometrial stripe and abutting the stripe in the mid uterus. Endometrium Thickness: 5.5 mm.  Intrauterine device in expected position. Right ovary Measurements: 3.4 x 1.9 x 1.6 cm. Normal appearance/no adnexal mass. Left ovary Measurements: 2.9 x 2.9 x 2.3 cm. Normal appearance/no adnexal mass. Pulsed Doppler evaluation of both ovaries demonstrates normal low-resistance arterial and venous waveforms. Other findings No abnormal free fluid. IMPRESSION: 1. 1.2 cm submucosal fibroid or endometrial polyp in the uterine fundus. 2. 1.6 cm submucosal fibroid in the mid uterus. 3. No acute abnormality. Electronically Signed   By: Claudie Revering M.D.   On: 11/30/2015 12:29   [place] Art/ven Flow Abd Pelv Doppler  Result Date: 11/30/2015 CLINICAL DATA:  Left lower quadrant abdominal pain for the past 3 days. Intrauterine device placed 1 month ago. History of uterine fibroids and myomectomy in May 2017. EXAM: TRANSABDOMINAL AND TRANSVAGINAL ULTRASOUND OF PELVIS DOPPLER ULTRASOUND OF OVARIES TECHNIQUE: Both transabdominal and transvaginal ultrasound examinations of the pelvis were performed. Transabdominal technique was performed for global imaging of the pelvis including uterus, ovaries, adnexal regions, and pelvic cul-de-sac. It was necessary to proceed with endovaginal exam following the transabdominal exam to visualize the uterus, endometrium and ovaries in better detail. Color and duplex Doppler ultrasound was utilized to evaluate blood flow to the ovaries. COMPARISON:  None. FINDINGS: Uterus Measurements: 9.1 x 5.9 x 4.4 cm. 1.2 x 1.0 x 0.9 cm oval, mildly heterogeneous mass in the uterine fundus in the region of the fundal portion of the endometrium. This has an echotexture  similar to the adjacent myometrium. There is also a 1.6 x 1.4 x 1.3 cm oval, less well-defined, mildly hypoechoic mass in the myometrium posterior to the endometrial stripe and abutting the stripe in the mid uterus. Endometrium Thickness: 5.5 mm.  Intrauterine device in expected position. Right ovary Measurements: 3.4 x 1.9 x 1.6 cm. Normal appearance/no adnexal mass. Left ovary Measurements: 2.9 x 2.9 x 2.3 cm. Normal appearance/no adnexal mass. Pulsed Doppler evaluation of both ovaries demonstrates normal low-resistance arterial and venous waveforms. Other findings No abnormal free fluid. IMPRESSION: 1. 1.2 cm submucosal fibroid or endometrial polyp in the uterine fundus. 2. 1.6 cm submucosal fibroid in the mid uterus. 3. No acute abnormality. Electronically Signed   By: Claudie Revering M.D.   On: 11/30/2015 12:29  Procedures Procedures (including critical care time)  Medications Ordered in ED Medications  acetaminophen (TYLENOL) tablet 1,000 mg (not administered)     Initial Impression / Assessment and Plan / ED Course  I have reviewed the triage vital signs and the nursing notes.  Pertinent labs & imaging results that were available during my care of the patient were reviewed by me and considered in my medical decision making (see chart for details).  Clinical Course    Afebrile nontoxic patient with left pelvic/left suprapubic pain that began 3 days ago.  UA does not appear infected.  Labs unremarkable, baseline chronic anemia.  US demonstrates two small fibroids.  No e/o TOA or torsion.  Pelvic exam with IUD in place, small amt yellow discharge.  Clinically no PID. No urinary symptoms,   UA does not appear infected.  Some pain with BM this morning but she is having regular bowel movements doubt colitis.  No fevers.  Nonsurgical abdomen.  Doubt surgical complication from sleeve procedure.  Advised close PCP or GYN follow up.  Discussed result, findings, treatment, and follow up  with patient.   Pt given return precautions.  Pt verbalizes understanding and agrees with plan.        Final Clinical Impressions(s) / ED Diagnoses   Final diagnoses:  Pelvic pain  Pelvic pain in female    New Prescriptions Current Discharge Medication List       Clayton Bibles, [place] 11/30/15 [place], MD 12/01/15 [phone]

## 2015-11-30 NOTE — ED Triage Notes (Signed)
Patient c/o L side abd dull pain since Thursday. She states that she experienced a sharp stabbing pain on that side Thursday that lasted a few hours and then she has been experiencing a dull pain since that time.

## 2015-11-30 NOTE — ED Notes (Signed)
Awaiting U/S, resting quietly on stretcher.

## 2015-11-30 NOTE — Discharge Instructions (Signed)
Read the information below.  You may return to the Emergency Department at any time for worsening condition or any new symptoms that concern you.  If you develop high fevers, worsening abdominal pain, uncontrolled vomiting, or are unable to tolerate fluids by mouth, return to the ER for a recheck.   °

## 2015-12-01 LAB — GC/CHLAMYDIA PROBE AMP (~~LOC~~) NOT AT ARMC
CHLAMYDIA, DNA PROBE: NEGATIVE
NEISSERIA GONORRHEA: NEGATIVE

## 2015-12-01 LAB — HIV ANTIBODY (ROUTINE TESTING W REFLEX): HIV SCREEN 4TH GENERATION: NONREACTIVE

## 2015-12-01 LAB — RPR: RPR: NONREACTIVE
